# Patient Record
Sex: Female | Born: 1990 | Race: Black or African American | Hispanic: No | Marital: Single | Smoking: Never smoker
Health system: Southern US, Community
[De-identification: ages and names within clinical notes are randomized; demographics above are authoritative.]

## PROBLEM LIST (undated history)

## (undated) ENCOUNTER — Inpatient Hospital Stay (HOSPITAL_COMMUNITY): Payer: Self-pay

## (undated) DIAGNOSIS — Z98891 History of uterine scar from previous surgery: Secondary | ICD-10-CM

## (undated) DIAGNOSIS — K8020 Calculus of gallbladder without cholecystitis without obstruction: Secondary | ICD-10-CM

## (undated) DIAGNOSIS — F32A Depression, unspecified: Secondary | ICD-10-CM

## (undated) DIAGNOSIS — D573 Sickle-cell trait: Secondary | ICD-10-CM

## (undated) DIAGNOSIS — F329 Major depressive disorder, single episode, unspecified: Secondary | ICD-10-CM

## (undated) DIAGNOSIS — R519 Headache, unspecified: Secondary | ICD-10-CM

## (undated) DIAGNOSIS — R51 Headache: Secondary | ICD-10-CM

## (undated) DIAGNOSIS — K219 Gastro-esophageal reflux disease without esophagitis: Secondary | ICD-10-CM

## (undated) DIAGNOSIS — N390 Urinary tract infection, site not specified: Secondary | ICD-10-CM

## (undated) DIAGNOSIS — Z9049 Acquired absence of other specified parts of digestive tract: Secondary | ICD-10-CM

## (undated) HISTORY — DX: Major depressive disorder, single episode, unspecified: F32.9

## (undated) HISTORY — DX: Urinary tract infection, site not specified: N39.0

## (undated) HISTORY — DX: Depression, unspecified: F32.A

## (undated) HISTORY — PX: APPENDECTOMY: SHX54

---

## 2014-07-12 ENCOUNTER — Encounter: Payer: Self-pay | Admitting: Family

## 2014-07-12 ENCOUNTER — Ambulatory Visit (INDEPENDENT_AMBULATORY_CARE_PROVIDER_SITE_OTHER)
Admission: RE | Admit: 2014-07-12 | Discharge: 2014-07-12 | Disposition: A | Discharge status: Home / Self Care | Payer: BLUE CROSS/BLUE SHIELD | Source: Ambulatory Visit | Attending: Family | Admitting: Family

## 2014-07-12 ENCOUNTER — Ambulatory Visit (INDEPENDENT_AMBULATORY_CARE_PROVIDER_SITE_OTHER): Payer: BLUE CROSS/BLUE SHIELD | Admitting: Family

## 2014-07-12 VITALS — BP 110/70 | HR 77 | Temp 98.7°F | Ht 66.5 in | Wt 117.2 lb

## 2014-07-12 DIAGNOSIS — M419 Scoliosis, unspecified: Secondary | ICD-10-CM

## 2014-07-12 NOTE — Progress Notes (Signed)
   Subjective:    Patient ID: Laura Krause, female    DOB: 02-24-1991, 24 y.o.   MRN: [id number]  Chief Complaint  Patient presents with  . Establish Care    Scoliosis    HPI:  Laura Krause is a 24 y.o. female who presents today to establish care and discuss her scoliosis.   1) Back pain - This is a chronic problem. Associated symptom of pain located in her back has been going on for about 5 years now. She was diagnosed with scoliosis previously and believes it is progressively worsening. She notes headaches, numbness and tingling in both legs that has progressively increased. Modifying factors include physical therapy which helped minimally. Last x-rays were done about 4 years ago. Pain is described as dull headache and her back is dull and sharp depending upon how her day goes. Timing of symptoms are worse during the end of the day. Headaches she has tried multiple over the counter medications which seem to help. Back pain intensity is rated about a 7-8/10.   No Known Allergies   No current outpatient prescriptions on file prior to visit.   No current facility-administered medications on file prior to visit.    Past Medical History  Diagnosis Date  . Depression   . UTI (lower urinary tract infection)     No past surgical history on file.  Family History  Problem Relation Age of Onset  . Healthy Mother   . Arthritis Maternal Grandmother   . Stroke Maternal Grandfather   . Hypertension Maternal Grandfather     History   Social History  . Marital Status: Single    Spouse Name: N/A  . Number of Children: N/A  . Years of Education: N/A   Occupational History  . Not on file.   Social History Main Topics  . Smoking status: Never Smoker   . Smokeless tobacco: Never Used  . Alcohol Use: Yes     Comment: Socially  . Drug Use: No  . Sexual Activity: Yes    Birth Control/ Protection: None   Other Topics Concern  . Not on file   Social History Narrative     Review of Systems  Musculoskeletal: Positive for back pain.  Neurological: Positive for numbness.      Objective:    BP 110/70 mmHg  Pulse 77  Temp(Src) 98.7 F (37.1 C) (Oral)  Ht 5' 6.5" (1.689 m)  Wt 117 lb 4 oz (53.184 kg)  BMI 18.64 kg/m2  SpO2 99%  LMP 07/09/2014 Nursing note and vital signs reviewed.  Physical Exam  Constitutional: She is oriented to person, place, and time. She appears well-developed and well-nourished. No distress.  Cardiovascular: Normal rate, regular rhythm, normal heart sounds and intact distal pulses.   Pulmonary/Chest: Effort normal and breath sounds normal.  Musculoskeletal:  No obvious discoloration or edema of thoracic spine. Increased curvature noted with forwards flexion. Left shoulder is lower than right shoulder.  Range of motion is complete in all directions. Sensation and pulses are intact and appropriate.   Neurological: She is alert and oriented to person, place, and time.  Skin: Skin is warm and dry.  Psychiatric: She has a normal mood and affect. Her behavior is normal. Judgment and thought content normal.       Assessment & Plan:

## 2014-07-12 NOTE — Progress Notes (Signed)
Pre visit review using our clinic review tool, if applicable. No additional management support is needed unless otherwise documented below in the visit note. 

## 2014-07-12 NOTE — Assessment & Plan Note (Signed)
Symptoms and exam consistent with scoliosis. This is also most likely the cause of her headaches and other symptoms for which she is experiencing. Obtain thoracic and lumbar films. Pending results, referral to scoliosis and spine specialists for follow-up.

## 2014-07-12 NOTE — Patient Instructions (Signed)
Thank you for choosing [facility].  Summary/Instructions:  Please stop by radiology on the basement level of the building for your x-rays. Your results will be released to MyChart (or called to you) after review, usually within 72 hours after test completion. If any treatments or changes are necessary, you will be notified at that same time.  If your symptoms worsen or fail to improve, please contact our office for further instruction, or in case of emergency go directly to the emergency room at the closest medical facility.    Scoliosis Scoliosis is the name given to a spine that curves sideways.Scoliosis can cause twisting of your shoulders, hips, chest, back, and rib cage.  CAUSES  The cause of scoliosis is not always known. It may be caused by a birth defect or by a disease that can cause muscular dysfunction and imbalance, such as cerebral palsy and muscular dystrophy.  RISK FACTORS Having a disease that causes muscle disease or dysfunction. SIGNS AND SYMPTOMS Scoliosis often has no signs or symptoms.If they are present, they may include:  Unequal size of one body side compared to the other (asymmetry).  Visible curvature of the spine.  Pain. The pain may limit physical activity.  Shortness of breath.  Bowel or bladder issues. DIAGNOSIS A skilled health care provider will perform an evaluation. This will involve:  Taking your history.  Performing a physical examination.  Performing a neurological exam to detect nerve or muscle function loss.  Range of motion studies on the spine.  X-rays. An MRI may also be obtained. TREATMENT  Treatment varies depending on the nature, extent, and severity of the disease. If the curvature is not great, you may need only observation. A brace may be used to prevent scoliosis from progressing. A brace may also be needed during growth spurts. Physical therapy may be of benefit. Surgery may be required.  HOME CARE INSTRUCTIONS    Your health care provider may suggest exercises to strengthen your muscles. Perform them as directed.  Ask your health care provider before participating in any sports.   If you have been prescribed an orthopedic brace, wear it as instructed by your health care provider. SEEK MEDICAL CARE IF: Your brace causes the skin to become sore (chafe) or is uncomfortable.  SEEK IMMEDIATE MEDICAL CARE IF:  You have back pain that is not relieved by the medicines prescribed by your health care provider.   Your legs feel weak or you lose function in your legs.  You lose some bowel or bladder control.  Document Released: 03/30/2000 Document Revised: 04/07/2013 Document Reviewed: 12/08/2012 [facility] Patient Information 2015 [place], [place]. This information is not intended to replace advice given to you by your health care provider. Make sure you discuss any questions you have with your health care provider.

## 2014-07-13 ENCOUNTER — Telehealth: Payer: Self-pay | Admitting: Family

## 2014-07-13 DIAGNOSIS — M4120 Other idiopathic scoliosis, site unspecified: Secondary | ICD-10-CM

## 2014-07-13 NOTE — Telephone Encounter (Signed)
Pt aware of results 

## 2014-07-13 NOTE — Telephone Encounter (Signed)
LVM for pt to call back.

## 2014-07-13 NOTE — Telephone Encounter (Signed)
Please inform the patient that her x-rays show a mid back scoliosis like we discussed. Therefore since she is having symptoms, we will send her to [facility].

## 2014-10-20 ENCOUNTER — Encounter (HOSPITAL_COMMUNITY): Payer: Self-pay | Admitting: Emergency Medicine

## 2014-10-20 ENCOUNTER — Emergency Department (HOSPITAL_COMMUNITY): Payer: BLUE CROSS/BLUE SHIELD

## 2014-10-20 ENCOUNTER — Observation Stay (HOSPITAL_COMMUNITY)
Admission: EM | Admit: 2014-10-20 | Discharge: 2014-10-21 | Disposition: A | Discharge status: Home / Self Care | Payer: BLUE CROSS/BLUE SHIELD | Attending: Internal Medicine | Admitting: Internal Medicine

## 2014-10-20 DIAGNOSIS — K529 Noninfective gastroenteritis and colitis, unspecified: Principal | ICD-10-CM | POA: Diagnosis present

## 2014-10-20 DIAGNOSIS — R000 Tachycardia, unspecified: Secondary | ICD-10-CM | POA: Diagnosis not present

## 2014-10-20 DIAGNOSIS — D72829 Elevated white blood cell count, unspecified: Secondary | ICD-10-CM | POA: Diagnosis not present

## 2014-10-20 DIAGNOSIS — R197 Diarrhea, unspecified: Secondary | ICD-10-CM

## 2014-10-20 DIAGNOSIS — R509 Fever, unspecified: Secondary | ICD-10-CM | POA: Diagnosis present

## 2014-10-20 DIAGNOSIS — R1010 Upper abdominal pain, unspecified: Secondary | ICD-10-CM | POA: Diagnosis present

## 2014-10-20 DIAGNOSIS — E876 Hypokalemia: Secondary | ICD-10-CM | POA: Insufficient documentation

## 2014-10-20 DIAGNOSIS — K8020 Calculus of gallbladder without cholecystitis without obstruction: Secondary | ICD-10-CM | POA: Insufficient documentation

## 2014-10-20 DIAGNOSIS — R112 Nausea with vomiting, unspecified: Secondary | ICD-10-CM

## 2014-10-20 DIAGNOSIS — R109 Unspecified abdominal pain: Secondary | ICD-10-CM | POA: Diagnosis present

## 2014-10-20 HISTORY — DX: Gastro-esophageal reflux disease without esophagitis: K21.9

## 2014-10-20 HISTORY — DX: Sickle-cell trait: D57.3

## 2014-10-20 HISTORY — DX: Headache, unspecified: R51.9

## 2014-10-20 HISTORY — DX: Headache: R51

## 2014-10-20 LAB — URINALYSIS, ROUTINE W REFLEX MICROSCOPIC
BILIRUBIN URINE: NEGATIVE
Glucose, UA: NEGATIVE mg/dL
KETONES UR: 15 mg/dL — AB
Leukocytes, UA: NEGATIVE
Nitrite: NEGATIVE
PH: 5 (ref 5.0–8.0)
Protein, ur: NEGATIVE mg/dL
Specific Gravity, Urine: 1.022 (ref 1.005–1.030)
Urobilinogen, UA: 0.2 mg/dL (ref 0.0–1.0)

## 2014-10-20 LAB — COMPREHENSIVE METABOLIC PANEL
ALT: 14 U/L (ref 14–54)
AST: 19 U/L (ref 15–41)
Albumin: 4.1 g/dL (ref 3.5–5.0)
Alkaline Phosphatase: 47 U/L (ref 38–126)
Anion gap: 9 (ref 5–15)
BUN: 7 mg/dL (ref 6–20)
CALCIUM: 8.8 mg/dL — AB (ref 8.9–10.3)
CO2: 24 mmol/L (ref 22–32)
CREATININE: 0.41 mg/dL — AB (ref 0.44–1.00)
Chloride: 105 mmol/L (ref 101–111)
Glucose, Bld: 141 mg/dL — ABNORMAL HIGH (ref 65–99)
Potassium: 3.3 mmol/L — ABNORMAL LOW (ref 3.5–5.1)
Sodium: 138 mmol/L (ref 135–145)
Total Bilirubin: 1.2 mg/dL (ref 0.3–1.2)
Total Protein: 7.2 g/dL (ref 6.5–8.1)

## 2014-10-20 LAB — CBC WITH DIFFERENTIAL/PLATELET
BASOS ABS: 0 10*3/uL (ref 0.0–0.1)
Basophils Relative: 0 % (ref 0–1)
Eosinophils Absolute: 0.1 10*3/uL (ref 0.0–0.7)
Eosinophils Relative: 1 % (ref 0–5)
HCT: 35.5 % — ABNORMAL LOW (ref 36.0–46.0)
Hemoglobin: 12.1 g/dL (ref 12.0–15.0)
LYMPHS ABS: 0.7 10*3/uL (ref 0.7–4.0)
LYMPHS PCT: 5 % — AB (ref 12–46)
MCH: 23.9 pg — ABNORMAL LOW (ref 26.0–34.0)
MCHC: 34.1 g/dL (ref 30.0–36.0)
MCV: 70 fL — ABNORMAL LOW (ref 78.0–100.0)
MONOS PCT: 4 % (ref 3–12)
Monocytes Absolute: 0.5 10*3/uL (ref 0.1–1.0)
NEUTROS PCT: 90 % — AB (ref 43–77)
Neutro Abs: 11.8 10*3/uL — ABNORMAL HIGH (ref 1.7–7.7)
PLATELETS: 263 10*3/uL (ref 150–400)
RBC: 5.07 MIL/uL (ref 3.87–5.11)
RDW: 21.8 % — AB (ref 11.5–15.5)
WBC: 13.1 10*3/uL — ABNORMAL HIGH (ref 4.0–10.5)

## 2014-10-20 LAB — POC URINE PREG, ED: PREG TEST UR: NEGATIVE

## 2014-10-20 LAB — URINE MICROSCOPIC-ADD ON

## 2014-10-20 LAB — LIPASE, BLOOD: LIPASE: 20 U/L — AB (ref 22–51)

## 2014-10-20 MED ORDER — PROMETHAZINE HCL 25 MG/ML IJ SOLN
12.5000 mg | Freq: Once | INTRAMUSCULAR | Status: DC
  Filled 2014-10-20: qty 1

## 2014-10-20 MED ORDER — ACETAMINOPHEN 500 MG PO TABS
1000.0000 mg | ORAL_TABLET | Freq: Once | ORAL | Status: AC
  Administered 2014-10-20: 1000 mg via ORAL
  Filled 2014-10-20: qty 2

## 2014-10-20 MED ORDER — IOHEXOL 300 MG/ML  SOLN
100.0000 mL | Freq: Once | INTRAMUSCULAR | Status: AC | PRN
  Administered 2014-10-20: 100 mL via INTRAVENOUS

## 2014-10-20 MED ORDER — PROMETHAZINE HCL 25 MG/ML IJ SOLN
12.5000 mg | Freq: Once | INTRAMUSCULAR | Status: AC
  Administered 2014-10-20: 12.5 mg via INTRAVENOUS
  Filled 2014-10-20: qty 1

## 2014-10-20 MED ORDER — SODIUM CHLORIDE 0.9 % IV SOLN
1000.0000 mL | Freq: Once | INTRAVENOUS | Status: AC
  Administered 2014-10-20: 1000 mL via INTRAVENOUS

## 2014-10-20 MED ORDER — ONDANSETRON HCL 4 MG/2ML IJ SOLN
4.0000 mg | Freq: Once | INTRAMUSCULAR | Status: AC
  Administered 2014-10-20: 4 mg via INTRAVENOUS
  Filled 2014-10-20: qty 2

## 2014-10-20 MED ORDER — GI COCKTAIL ~~LOC~~
30.0000 mL | Freq: Once | ORAL | Status: AC
  Administered 2014-10-20: 30 mL via ORAL
  Filled 2014-10-20: qty 30

## 2014-10-20 MED ORDER — MORPHINE SULFATE 2 MG/ML IJ SOLN
2.0000 mg | INTRAMUSCULAR | Status: AC | PRN
  Administered 2014-10-20 – 2014-10-21 (×2): 2 mg via INTRAVENOUS
  Filled 2014-10-20 (×2): qty 1

## 2014-10-20 MED ORDER — FENTANYL CITRATE (PF) 100 MCG/2ML IJ SOLN
50.0000 ug | Freq: Once | INTRAMUSCULAR | Status: AC
  Administered 2014-10-20: 50 ug via INTRAVENOUS
  Filled 2014-10-20: qty 2

## 2014-10-20 MED ORDER — IOHEXOL 300 MG/ML  SOLN
25.0000 mL | INTRAMUSCULAR | Status: AC
  Administered 2014-10-20: 25 mL via ORAL

## 2014-10-20 MED ORDER — ONDANSETRON HCL 4 MG PO TABS: 4.0000 mg | ORAL_TABLET | Freq: Four times a day (QID) | ORAL | Status: DC | PRN

## 2014-10-20 MED ORDER — LOPERAMIDE HCL 2 MG PO CAPS: 4.0000 mg | ORAL_CAPSULE | Freq: Once | ORAL | Status: DC

## 2014-10-20 MED ORDER — ACETAMINOPHEN 650 MG RE SUPP: 650.0000 mg | Freq: Four times a day (QID) | RECTAL | Status: DC | PRN

## 2014-10-20 MED ORDER — SODIUM CHLORIDE 0.9 % IV BOLUS (SEPSIS)
1000.0000 mL | Freq: Once | INTRAVENOUS | Status: AC
  Administered 2014-10-20: 1000 mL via INTRAVENOUS

## 2014-10-20 MED ORDER — POTASSIUM CHLORIDE CRYS ER 20 MEQ PO TBCR
40.0000 meq | EXTENDED_RELEASE_TABLET | Freq: Once | ORAL | Status: AC
  Administered 2014-10-20: 40 meq via ORAL
  Filled 2014-10-20: qty 2

## 2014-10-20 MED ORDER — PIPERACILLIN-TAZOBACTAM 3.375 G IVPB
3.3750 g | Freq: Once | INTRAVENOUS | Status: AC
  Administered 2014-10-20: 3.375 g via INTRAVENOUS
  Filled 2014-10-20: qty 50

## 2014-10-20 MED ORDER — CIPROFLOXACIN IN D5W 400 MG/200ML IV SOLN
400.0000 mg | Freq: Two times a day (BID) | INTRAVENOUS | Status: DC
  Administered 2014-10-20 – 2014-10-21 (×2): 400 mg via INTRAVENOUS
  Filled 2014-10-20 (×4): qty 200

## 2014-10-20 MED ORDER — ENOXAPARIN SODIUM 40 MG/0.4ML ~~LOC~~ SOLN
40.0000 mg | SUBCUTANEOUS | Status: DC
  Administered 2014-10-20: 40 mg via SUBCUTANEOUS
  Filled 2014-10-20 (×2): qty 0.4

## 2014-10-20 MED ORDER — KETOROLAC TROMETHAMINE 15 MG/ML IJ SOLN
15.0000 mg | Freq: Four times a day (QID) | INTRAMUSCULAR | Status: DC | PRN
  Administered 2014-10-20: 15 mg via INTRAVENOUS
  Filled 2014-10-20 (×2): qty 1

## 2014-10-20 MED ORDER — DICYCLOMINE HCL 10 MG/ML IM SOLN
20.0000 mg | Freq: Once | INTRAMUSCULAR | Status: AC
  Administered 2014-10-20: 20 mg via INTRAMUSCULAR
  Filled 2014-10-20: qty 2

## 2014-10-20 MED ORDER — ACETAMINOPHEN 325 MG PO TABS
650.0000 mg | ORAL_TABLET | Freq: Four times a day (QID) | ORAL | Status: DC | PRN
  Administered 2014-10-20: 650 mg via ORAL
  Filled 2014-10-20: qty 2

## 2014-10-20 MED ORDER — PANTOPRAZOLE SODIUM 40 MG IV SOLR
40.0000 mg | Freq: Once | INTRAVENOUS | Status: AC
  Administered 2014-10-20: 40 mg via INTRAVENOUS
  Filled 2014-10-20: qty 40

## 2014-10-20 MED ORDER — ONDANSETRON HCL 4 MG/2ML IJ SOLN
4.0000 mg | Freq: Four times a day (QID) | INTRAMUSCULAR | Status: DC | PRN
  Administered 2014-10-20: 4 mg via INTRAVENOUS
  Filled 2014-10-20: qty 2

## 2014-10-20 MED ORDER — FOLIC ACID 5 MG/ML IJ SOLN
1.0000 mg | Freq: Every day | INTRAMUSCULAR | Status: DC
  Administered 2014-10-20: 1 mg via INTRAVENOUS
  Filled 2014-10-20 (×2): qty 0.2

## 2014-10-20 MED ORDER — METRONIDAZOLE IN NACL 5-0.79 MG/ML-% IV SOLN
500.0000 mg | Freq: Three times a day (TID) | INTRAVENOUS | Status: DC
  Administered 2014-10-20 – 2014-10-21 (×3): 500 mg via INTRAVENOUS
  Filled 2014-10-20 (×7): qty 100

## 2014-10-20 MED ORDER — THIAMINE HCL 100 MG/ML IJ SOLN
100.0000 mg | Freq: Every day | INTRAMUSCULAR | Status: DC
  Administered 2014-10-20: 100 mg via INTRAVENOUS
  Filled 2014-10-20: qty 1
  Filled 2014-10-20: qty 2

## 2014-10-20 NOTE — ED Notes (Signed)
Pt states that she starting having abdominal pain around 2000 last night. Pt states that she started vomiting and having diarrhea at midnight. Pt states that she is unable to keep anything down.

## 2014-10-20 NOTE — ED Notes (Signed)
Pt states she threw up when she went to the bathroom.

## 2014-10-20 NOTE — ED Notes (Signed)
Ambulated pt to bathroom. Noted she had to throw up.

## 2014-10-20 NOTE — ED Notes (Signed)
Pt resting, no needs at this time; visitor at bedside

## 2014-10-20 NOTE — ED Notes (Signed)
Revonda StandardAllison, NP at the bedside to admit.

## 2014-10-20 NOTE — ED Provider Notes (Signed)
Patient's care assumed at sign out, 0700. On my initial exam the patient remains uncomfortable appearing, with intermittent abdominal pain, persistent tachycardia. Given the patient's persistent tachycardia, symptoms, IV fluids continued.  Update: Patient states that her pain has improved, though her nausea, anorexia or persistent, and after 3 L of fluid resuscitation, she remained tachycardic.  Update: Patient now febrile, 102, with tachycardia, abdominal pain, CT scan performed.  3:09 PM I reviewed the results (including imaging), agree with the interpretation.   On repeat exam the patient appears better.  We reviewed all findings.  Given the persistent tachycardia, fever, nausea, anorexia, patient was treated with IV antibiotics, will be admitted for observation, further evaluation and management.   Gerhard Munchobert Rashema Seawright, MD 10/20/14 [phone]

## 2014-10-20 NOTE — ED Notes (Signed)
Attempted report 

## 2014-10-20 NOTE — H&P (Signed)
[facility] History and Physical                                                                                    Laura HammedJelesha Krause, is a 24 y.o. female  MRN: [id number]   DOB - Oct 31, 1990  Admit Date - 10/20/2014  Outpatient Primary MD for the patient is Jeanine Luzalone, Gregory, FNP  Referring MD: Jeraldine LootsLockwood / ER  With History of -  Past Medical History  Diagnosis Date  . Depression   . UTI (lower urinary tract infection)       History reviewed. No pertinent past surgical history.  in for   Chief Complaint  Patient presents with  . Nausea  . Emesis  . Abdominal Pain     HPI This is a 24 year old female patient with no significant medical problems other than prior UTI and depression. She presents to the hospital with abrupt onset of nausea vomiting with diarrhea that began last night. She correlates this with ingestion of pasta salad the evening before. She reports her symptoms began as reflux type symptoms and a sensation of feeling full or bloated. This was subsequently followed by repeated episodes of nausea and vomiting with non- bloody watery diarrhea. She finally presented to the ER around 4 AM because of severe abdominal pain and fever. Denies recent antibiotics or other sick contacts.  Upon presentation to the ER she was afebrile and slightly tachycardic with a heart rate of 101 and she was normotensive with a BP of 104/70. Her initial lab work was unremarkable except for potassium of 3.3, glucose 141, lipase was oh at 20, white count was slightly elevated at 13,100 with a neutrophil count of 90%, MCV low at 70. CT of the abdomen and pelvis was unremarkable except for solitary gallstone. Since arrival patient has been given at least 3 L of IV fluid and multiple doses of pain medications as well as a GI cocktail, Imodium, Zofran, Protonix and Phenergan. Her nausea has improved and she's had no further episodes of emesis or diarrhea for at least 4 hours. Her abdominal pain has also  resolved and she is tolerating water. Unfortunately around noon she spiked a temperature to 102.1 which has subsequently decreased to 100.7. While febrile she's had heart rates in the 120 range. She is maintaining O2 saturations above 90% on room air. EDP discussed the case with general surgery who found no acute surgical needs at this time.   Review of Systems   In addition to the HPI above,  No Headache, changes with Vision or hearing, new weakness, tingling, numbness in any extremity, No problems swallowing food or Liquids, indigestion/reflux No Chest pain, Cough or Shortness of Breath, palpitations, orthopnea or DOE No melena or hematochezia, no dark tarry stools No dysuria, hematuria or flank pain No new skin rashes, lesions, masses or bruises, No new joints pains-aches No recent weight gain or loss No polyuria, polydypsia or polyphagia,  *A full 10 point Review of Systems was done, except as stated above, all other Review of Systems were negative.  Social History History  Substance Use Topics  . Smoking status: Never Smoker   . Smokeless tobacco: Never  Used  . Alcohol Use: Yes     Comment: Socially    Resides at: Private residence  Lives with: Alone  Ambulatory status: Without assistive devices  Employment status: Consulting civil engineer at [employer]   Family History Family History  Problem Relation Age of Onset  . Healthy Mother   . Arthritis Maternal Grandmother   . Stroke Maternal Grandfather   . Hypertension Maternal Grandfather      Prior to Admission medications   Not on File    No Known Allergies  Physical Exam  Vitals  Blood pressure 97/46, pulse 121, temperature 100.7 F (38.2 C), temperature source Oral, resp. rate 16, height 5' 6.5" (1.689 m), weight 120 lb (54.432 kg), last menstrual period 09/24/2014, SpO2 98 %.   General:  In no acute distress, does not appear toxic and is reporting able to tolerate water  Psych:  Normal affect, Denies Suicidal or  Homicidal ideations, Awake Alert, Oriented X 3. Speech and thought patterns are clear and appropriate, no apparent short term memory deficits  Neuro:   No focal neurological deficits, CN II through XII intact, Strength 5/5 all 4 extremities, Sensation intact all 4 extremities.  ENT:  Ears and Eyes appear Normal, Conjunctivae clear, PER. Moist oral mucosa without erythema or exudates.  Neck:  Supple, No lymphadenopathy appreciated  Respiratory:  Symmetrical chest wall movement, Good air movement bilaterally, CTAB. Room Air  Cardiac:  RRR, No Murmurs, no LE edema noted, no JVD, No carotid bruits, peripheral pulses palpable at 2+  Abdomen:  Positive bowel sounds, Soft, Non tender, Non distended,  No masses appreciated, no obvious hepatosplenomegaly  Skin:  No Cyanosis, Normal Skin Turgor, No Skin Rash or Bruise.  Extremities: Symmetrical without obvious trauma or injury,  no effusions.  Data Review  CBC  Recent Labs Lab 10/20/14 0412  WBC 13.1*  HGB 12.1  HCT 35.5*  PLT 263  MCV 70.0*  MCH 23.9*  MCHC 34.1  RDW 21.8*  LYMPHSABS 0.7  MONOABS 0.5  EOSABS 0.1  BASOSABS 0.0    Chemistries   Recent Labs Lab 10/20/14 0412  NA 138  K 3.3*  CL 105  CO2 24  GLUCOSE 141*  BUN 7  CREATININE 0.41*  CALCIUM 8.8*  AST 19  ALT 14  ALKPHOS 47  BILITOT 1.2    estimated creatinine clearance is 94.7 mL/min (by C-G formula based on Cr of 0.41).  No results for input(s): TSH, T4TOTAL, T3FREE, THYROIDAB in the last 72 hours.  Invalid input(s): FREET3  Coagulation profile No results for input(s): INR, PROTIME in the last 168 hours.  No results for input(s): DDIMER in the last 72 hours.  Cardiac Enzymes No results for input(s): CKMB, TROPONINI, MYOGLOBIN in the last 168 hours.  Invalid input(s): CK  Invalid input(s): POCBNP  Urinalysis    Component Value Date/Time   COLORURINE AMBER* 10/20/2014 0648   APPEARANCEUR CLEAR 10/20/2014 0648   LABSPEC 1.022 10/20/2014  0648   PHURINE 5.0 10/20/2014 0648   GLUCOSEU NEGATIVE 10/20/2014 0648   HGBUR TRACE* 10/20/2014 0648   BILIRUBINUR NEGATIVE 10/20/2014 0648   KETONESUR 15* 10/20/2014 0648   PROTEINUR NEGATIVE 10/20/2014 0648   UROBILINOGEN 0.2 10/20/2014 0648   NITRITE NEGATIVE 10/20/2014 0648   LEUKOCYTESUR NEGATIVE 10/20/2014 0648    Imaging results:   Ct Abdomen Pelvis W Contrast  10/20/2014   CLINICAL DATA:  Predominantly periumbilical abdominal pain beginning last night. Vomiting and diarrhea. Leukocytosis.  EXAM: CT ABDOMEN AND PELVIS WITH CONTRAST  TECHNIQUE:  Multidetector CT imaging of the abdomen and pelvis was performed using the standard protocol following bolus administration of intravenous contrast.  CONTRAST:  OMNIPAQUE IOHEXOL 300 MG/ML  SOLN  COMPARISON:  None.  FINDINGS: The visualized lung bases are clear.  A small stone is noted in the gall gallbladder. The spleen is borderline enlarged. The liver, adrenal glands, kidneys, and pancreas are unremarkable.  There is no evidence of bowel obstruction. No gross bowel wall thickening is identified. Appendix is visualized in the right lower quadrant and is unremarkable.  Bladder is unremarkable. No pelvic mass is seen. No free fluid or enlarged lymph nodes are seen. No acute osseous abnormality is identified.  IMPRESSION: 1. No acute abnormality identified in the abdomen or pelvis. 2. Cholelithiasis.   Electronically Signed   By: Sebastian Ache   On: 10/20/2014 13:30      Assessment & Plan  Principal Problem:   Gastroenteritis, acute -Admit observational status to medical floor -Differential includes food poisoning/foodborne illness versus gastroenteritis -Empiric Cipro and Flagyl IV -Follow up on labs in a.m. -Allow clear liquids and slowly advance diet -As precaution check stools for C. difficile PCR  Active Problems:   Fever/ Leukocytosis -Suspect related to acute gastroenteritis either viral or bacterial in etiology    Abdominal  pain, acute -Currently has resolved -Likely secondary to gastroenteritis -CT abdomen and pelvis unremarkable except for solitary gallstone -EDP discussed with general surgery who found no acute surgical needs at this time    Acute hypokalemia -Secondary to emesis and diarrhea -Since tolerating clear liquids with saltine crackers will try 1 dose of oral potassium -Check labs in a.m.    Tachycardia -Remains sinus in nature and appears to be appropriate physiologic response in a young person to dehydration and fever -No indication for telemetry -Continue IV fluid hydration    DVT Prophylaxis: Lovenox  Family Communication:   No family at bedside  Code Status:  Full code  Condition:  Stable  Discharge disposition: Anticipate discharge to home in the next 24 hours upon resolution of nausea vomiting and diarrhea symptoms as well as tachycardia and dehydration  Time spent in minutes : 60      ELLIS,ALLISON L. ANP on 10/20/2014 at 3:57 PM  Between 7am to 7pm - Pager - [phone]  After 7pm go to [email or web address] - password [email or web address]  And look for the night coverage person covering me after hours  [facility]  Patient was seen, examined,treatment plan was discussed with the Advance Practice Provider.  I have directly reviewed the clinical findings, lab, imaging studies and management of this patient in detail. I have made the necessary changes to the above noted documentation, and agree with the documentation, as recorded by the Advance Practice Provider.   Pamella Pert, MD [facility] [phone]

## 2014-10-20 NOTE — Progress Notes (Signed)
Report received by ED RN Hayley.

## 2014-10-20 NOTE — ED Provider Notes (Signed)
CSN: [id number]     Arrival date & time 10/20/14  0357 History   First MD Initiated Contact with Patient 10/20/14 [phone]     Chief Complaint  Patient presents with  . Nausea  . Emesis  . Abdominal Pain     (Consider location/radiation/quality/duration/timing/severity/associated sxs/prior Treatment) HPI 24 year old female presents to the emergency department from home with complaint of upper abdominal pain, nausea, vomiting and diarrhea after having pasta salad.  No fevers or chills.  No sick contacts, no one else ate the posterior falx.  Patient reports she has been vomiting too numerous times to count Past Medical History  Diagnosis Date  . Depression   . UTI (lower urinary tract infection)    History reviewed. No pertinent past surgical history. Family History  Problem Relation Age of Onset  . Healthy Mother   . Arthritis Maternal Grandmother   . Stroke Maternal Grandfather   . Hypertension Maternal Grandfather    History  Substance Use Topics  . Smoking status: Never Smoker   . Smokeless tobacco: Never Used  . Alcohol Use: Yes     Comment: Socially   OB History    No data available     Review of Systems  See History of Present Illness; otherwise all other systems are reviewed and negative   Allergies  Review of patient's allergies indicates no known allergies.  Home Medications   Prior to Admission medications   Medication Sig Start Date End Date Taking? Authorizing Provider  Aspirin POWD 1 packet by Does not apply route as needed (migraines).    Historical Provider, MD   BP 110/68 mmHg  Pulse 101  Temp(Src) 98 F (36.7 C) (Oral)  Resp 17  Ht 5' 6.5" (1.689 m)  Wt 120 lb (54.432 kg)  BMI 19.08 kg/m2  SpO2 100%  LMP 09/24/2014 Physical Exam  Constitutional: She is oriented to person, place, and time. She appears well-developed and well-nourished.  HENT:  Head: Normocephalic and atraumatic.  Nose: Nose normal.  Mouth/Throat: Oropharynx is clear and  moist.  Dry mucous membranes  Eyes: Conjunctivae and EOM are normal. Pupils are equal, round, and reactive to light.  Neck: Normal range of motion. Neck supple. No JVD present. No tracheal deviation present. No thyromegaly present.  Cardiovascular: Normal rate, regular rhythm, normal heart sounds and intact distal pulses.  Exam reveals no gallop and no friction rub.   No murmur heard. Pulmonary/Chest: Effort normal and breath sounds normal. No stridor. No respiratory distress. She has no wheezes. She has no rales. She exhibits no tenderness.  Abdominal: Soft. Bowel sounds are normal. She exhibits no distension and no mass. There is tenderness (tenderness across epigastrium.). There is no rebound and no guarding.  Musculoskeletal: Normal range of motion. She exhibits no edema or tenderness.  Lymphadenopathy:    She has no cervical adenopathy.  Neurological: She is alert and oriented to person, place, and time. She displays normal reflexes. She exhibits normal muscle tone. Coordination normal.  Skin: Skin is warm and dry. No rash noted. No erythema. No pallor.  Psychiatric: She has a normal mood and affect. Her behavior is normal. Judgment and thought content normal.  Nursing note and vitals reviewed.   ED Course  Procedures (including critical care time) Labs Review Labs Reviewed  CBC WITH DIFFERENTIAL/PLATELET - Abnormal; Notable for the following:    WBC 13.1 (*)    HCT 35.5 (*)    MCV 70.0 (*)    MCH 23.9 (*)  RDW 21.8 (*)    Neutrophils Relative % 90 (*)    Lymphocytes Relative 5 (*)    Neutro Abs 11.8 (*)    All other components within normal limits  COMPREHENSIVE METABOLIC PANEL - Abnormal; Notable for the following:    Potassium 3.3 (*)    Glucose, Bld 141 (*)    Creatinine, Ser 0.41 (*)    Calcium 8.8 (*)    All other components within normal limits  LIPASE, BLOOD - Abnormal; Notable for the following:    Lipase 20 (*)    All other components within normal limits   URINALYSIS, ROUTINE W REFLEX MICROSCOPIC (NOT AT [facility]) - Abnormal; Notable for the following:    Color, Urine AMBER (*)    Hgb urine dipstick TRACE (*)    Ketones, ur 15 (*)    All other components within normal limits  URINE MICROSCOPIC-ADD ON - Abnormal; Notable for the following:    Bacteria, UA FEW (*)    All other components within normal limits  POC URINE PREG, ED    Imaging Review No results found.   EKG Interpretation None      MDM   Final diagnoses:  Nausea vomiting and diarrhea    24 year old female with nausea vomiting diarrhea after pasta salad.  Gastroenteritis from food borne illness versus viral etiology.  Plan for IV hydration, labs, antiemetics.   Patient feeling better, however, now with increasing pain and nausea.  Labs show slightly elevated white blood cell count, otherwise unremarkable.  Awaiting urinalysis.  7:25 AM Pt c/o nausea again.  Abdomen reexamined, mild ttp over upper abdomen.  No rebound or guarding.   no [place] sign.  Care passed to Dr Jeraldine LootsLockwood awaiting second liter, phenergan dosing.  Laura Severinlga Mayanna Garlitz, MD 10/20/14 [phone]

## 2014-10-20 NOTE — Progress Notes (Signed)
NURSING PROGRESS NOTE  Laura Krause [id number] Admission Data: 10/20/2014 6:29 PM Attending Provider: Leatha Gildingostin M Gherghe, MD WUJ:[id number]:Calone, Earl LitesGregory, FNP Code Status: Full  Allergies:  Review of patient's allergies indicates no known allergies. Past Medical History:   has a past medical history of Depression and UTI (lower urinary tract infection). Past Surgical History:   has no past surgical history on file. Social History:   reports that she has never smoked. She has never used smokeless tobacco. She reports that she drinks alcohol. She reports that she does not use illicit drugs.  Laura Krause is a 24 y.o. female patient admitted from ED:   Last Documented Vital Signs: Blood pressure 116/67, pulse 122, temperature 99.5 F (37.5 C), temperature source Oral, resp. rate 18, height 5' 6.5" (1.689 m), weight 54.432 kg (120 lb), last menstrual period 09/24/2014, SpO2 100 %.  Cardiac Monitoring:  None ordered.  IV Fluids:  IV in place, occlusive dsg intact without redness, IV cath antecubital right, condition patent and no redness none.   Skin: WDL  Patient/Family orientated to room. Information packet given to patient/family. Admission inpatient armband information verified with patient/family to include name and date of birth and placed on patient arm. Side rails up x 2, fall assessment and education completed with patient/family. Patient/family able to verbalize understanding of risk associated with falls and verbalized understanding to call for assistance before getting out of bed. Call light within reach. Patient/family able to voice and demonstrate understanding of unit orientation instructions.    Will continue to evaluate and treat per MD orders.   Leane PlattSpencer Maleeya Peterkin RN, BS, BSN

## 2014-10-21 DIAGNOSIS — K529 Noninfective gastroenteritis and colitis, unspecified: Secondary | ICD-10-CM | POA: Diagnosis not present

## 2014-10-21 LAB — CLOSTRIDIUM DIFFICILE BY PCR: Toxigenic C. Difficile by PCR: NEGATIVE

## 2014-10-21 LAB — CBC
HCT: 29.3 % — ABNORMAL LOW (ref 36.0–46.0)
Hemoglobin: 9.8 g/dL — ABNORMAL LOW (ref 12.0–15.0)
MCH: 24 pg — AB (ref 26.0–34.0)
MCHC: 33.4 g/dL (ref 30.0–36.0)
MCV: 71.6 fL — AB (ref 78.0–100.0)
PLATELETS: 163 10*3/uL (ref 150–400)
RBC: 4.09 MIL/uL (ref 3.87–5.11)
RDW: 21.9 % — ABNORMAL HIGH (ref 11.5–15.5)
WBC: 7.2 10*3/uL (ref 4.0–10.5)

## 2014-10-21 LAB — COMPREHENSIVE METABOLIC PANEL
ALK PHOS: 32 U/L — AB (ref 38–126)
ALT: 14 U/L (ref 14–54)
ANION GAP: 5 (ref 5–15)
AST: 20 U/L (ref 15–41)
Albumin: 3 g/dL — ABNORMAL LOW (ref 3.5–5.0)
CHLORIDE: 110 mmol/L (ref 101–111)
CO2: 23 mmol/L (ref 22–32)
Calcium: 7.9 mg/dL — ABNORMAL LOW (ref 8.9–10.3)
Creatinine, Ser: 0.45 mg/dL (ref 0.44–1.00)
GLUCOSE: 103 mg/dL — AB (ref 65–99)
Potassium: 3.9 mmol/L (ref 3.5–5.1)
Sodium: 138 mmol/L (ref 135–145)
Total Bilirubin: 1.7 mg/dL — ABNORMAL HIGH (ref 0.3–1.2)
Total Protein: 5.5 g/dL — ABNORMAL LOW (ref 6.5–8.1)

## 2014-10-21 MED ORDER — ONDANSETRON HCL 4 MG PO TABS: 4.0000 mg | ORAL_TABLET | Freq: Four times a day (QID) | ORAL | Status: DC | PRN

## 2014-10-21 NOTE — Progress Notes (Signed)
24 year old independent female from home with viral gastro vs food poisening. No home needs identified, discharge anticipated today if tolerates diet.

## 2014-10-21 NOTE — Progress Notes (Signed)
Laura HammedJelesha Krause to be D/C'd Home per MD order.  Discussed with the patient and all questions fully answered.  VSS, Skin clean, dry and intact without evidence of skin break down, no evidence of skin tears noted. IV catheter discontinued intact. Site without signs and symptoms of complications. Dressing and pressure applied.  An After Visit Summary was printed and given to the patient. Patient received prescription.  D/c education completed with patient/family including follow up instructions, medication list, d/c activities limitations if indicated, with other d/c instructions as indicated by MD - patient able to verbalize understanding, all questions fully answered.   Patient instructed to return to ED, call 911, or call MD for any changes in condition.   Patient escorted via WC, and D/C home via private auto.  L'ESPERANCE, [name] 10/21/2014 5:41 PM

## 2014-10-21 NOTE — Progress Notes (Signed)
MD updated on patient status. Patient stating "I am feeling better". Pt denies nausea and vomiting. Pt has had 4 episodes of diarrhea today.

## 2014-10-21 NOTE — Discharge Summary (Signed)
Physician Discharge Summary  Laura Krause MRN: [id number] DOB/AGE: 22/22/1992 22 y.o.  PCP: Mauricio Po, FNP   Admit date: 10/20/2014 Discharge date: 10/21/2014  Discharge Diagnoses:     Principal Problem:   Gastroenteritis, acute Probable food poisoning   Fever   Tachycardia   Abdominal pain, acute   Acute hypokalemia   Leukocytosis Cholelithiasis   Follow-up recommendations Follow-up with PCP in 3-5 days , including although additional recommended appointments as below Follow-up CBC, CMP in 3-5 days      Medication List    TAKE these medications        ondansetron 4 MG tablet  Commonly known as:  ZOFRAN  Take 1 tablet (4 mg total) by mouth every 6 (six) hours as needed for nausea.         Discharge Condition: Stable    Disposition: Final discharge disposition not confirmed   Consults: None    Significant Diagnostic Studies:  Ct Abdomen Pelvis W Contrast  10/20/2014   CLINICAL DATA:  Predominantly periumbilical abdominal pain beginning last night. Vomiting and diarrhea. Leukocytosis.  EXAM: CT ABDOMEN AND PELVIS WITH CONTRAST  TECHNIQUE: Multidetector CT imaging of the abdomen and pelvis was performed using the standard protocol following bolus administration of intravenous contrast.  CONTRAST:  148m OMNIPAQUE IOHEXOL 300 MG/ML  SOLN  COMPARISON:  None.  FINDINGS: The visualized lung bases are clear.  A small stone is noted in the gall gallbladder. The spleen is borderline enlarged. The liver, adrenal glands, kidneys, and pancreas are unremarkable.  There is no evidence of bowel obstruction. No gross bowel wall thickening is identified. Appendix is visualized in the right lower quadrant and is unremarkable.  Bladder is unremarkable. No pelvic mass is seen. No free fluid or enlarged lymph nodes are seen. No acute osseous abnormality is identified.  IMPRESSION: 1. No acute abnormality identified in the abdomen or pelvis. 2. Cholelithiasis.   Electronically  Signed   By: ALogan Bores  On: 10/20/2014 13:30      Filed Weights   10/20/14 0403 10/20/14 1825  Weight: 54.432 kg (120 lb) 54.023 kg (119 lb 1.6 oz)     Microbiology: Recent Results (from the past 240 hour(s))  [facility] ([facility]     Status: None   Collection Time: 10/20/14  7:37 PM  Result Value Ref Range Status   C difficile by pcr NEGATIVE NEGATIVE Final       Blood Culture No results found for: SDES, SPECREQUEST, CULT, REPTSTATUS    Labs: Results for orders placed or performed during the hospital encounter of 10/20/14 (from the past 48 hour(s))  CBC with Differential     Status: Abnormal   Collection Time: 10/20/14  4:12 AM  Result Value Ref Range   WBC 13.1 (H) 4.0 - 10.5 K/uL   RBC 5.07 3.87 - 5.11 MIL/uL   Hemoglobin 12.1 12.0 - 15.0 g/dL   HCT 35.5 (L) 36.0 - 46.0 %   MCV 70.0 (L) 78.0 - 100.0 fL   MCH 23.9 (L) 26.0 - 34.0 pg   MCHC 34.1 30.0 - 36.0 g/dL   RDW 21.8 (H) 11.5 - 15.5 %   Platelets 263 150 - 400 K/uL    Comment: SPECIMEN CHECKED FOR CLOTS REPEATED TO VERIFY PLATELET COUNT CONFIRMED BY SMEAR    Neutrophils Relative % 90 (H) 43 - 77 %   Lymphocytes Relative 5 (L) 12 - 46 %   Monocytes Relative 4 3 - 12 %  Eosinophils Relative 1 0 - 5 %   Basophils Relative 0 0 - 1 %   Neutro Abs 11.8 (H) 1.7 - 7.7 K/uL   Lymphs Abs 0.7 0.7 - 4.0 K/uL   Monocytes Absolute 0.5 0.1 - 1.0 K/uL   Eosinophils Absolute 0.1 0.0 - 0.7 K/uL   Basophils Absolute 0.0 0.0 - 0.1 K/uL   RBC Morphology TARGET CELLS     Comment: TEARDROP CELLS POLYCHROMASIA PRESENT   Comprehensive metabolic panel     Status: Abnormal   Collection Time: 10/20/14  4:12 AM  Result Value Ref Range   Sodium 138 135 - 145 mmol/L   Potassium 3.3 (L) 3.5 - 5.1 mmol/L   Chloride 105 101 - 111 mmol/L   CO2 24 22 - 32 mmol/L   Glucose, Bld 141 (H) 65 - 99 mg/dL   BUN 7 6 - 20 mg/dL   Creatinine, Ser 0.41 (L) 0.44 - 1.00 mg/dL   Calcium 8.8 (L) 8.9 - 10.3 mg/dL    Total Protein 7.2 6.5 - 8.1 g/dL   Albumin 4.1 3.5 - 5.0 g/dL   AST 19 15 - 41 U/L   ALT 14 14 - 54 U/L   Alkaline Phosphatase 47 38 - 126 U/L   Total Bilirubin 1.2 0.3 - 1.2 mg/dL   GFR calc non Af Amer >60 >60 mL/min   GFR calc Af Amer >60 >60 mL/min    Comment: (NOTE) The eGFR has been calculated using the CKD EPI equation. This calculation has not been validated in all clinical situations. eGFR's persistently <60 mL/min signify possible Chronic Kidney Disease.    Anion gap 9 5 - 15  Lipase, blood     Status: Abnormal   Collection Time: 10/20/14  4:12 AM  Result Value Ref Range   Lipase 20 (L) 22 - 51 U/L  Urinalysis, Routine w reflex microscopic (not at [facility])     Status: Abnormal   Collection Time: 10/20/14  6:48 AM  Result Value Ref Range   Color, Urine AMBER (A) YELLOW    Comment: BIOCHEMICALS MAY BE AFFECTED BY COLOR   APPearance CLEAR CLEAR   Specific Gravity, Urine 1.022 1.005 - 1.030   pH 5.0 5.0 - 8.0   Glucose, UA NEGATIVE NEGATIVE mg/dL   Hgb urine dipstick TRACE (A) NEGATIVE   Bilirubin Urine NEGATIVE NEGATIVE   Ketones, ur 15 (A) NEGATIVE mg/dL   Protein, ur NEGATIVE NEGATIVE mg/dL   Urobilinogen, UA 0.2 0.0 - 1.0 mg/dL   Nitrite NEGATIVE NEGATIVE   Leukocytes, UA NEGATIVE NEGATIVE  Urine microscopic-add on     Status: Abnormal   Collection Time: 10/20/14  6:48 AM  Result Value Ref Range   Squamous Epithelial / LPF RARE RARE   WBC, UA 0-2 <3 WBC/hpf   RBC / HPF 3-6 <3 RBC/hpf   Bacteria, UA FEW (A) RARE   Urine-Other MUCOUS PRESENT   POC Urine Pregnancy, ED  (If Pre-menopausal female)  not at [facility]     Status: None   Collection Time: 10/20/14  6:51 AM  Result Value Ref Range   Preg Test, Ur NEGATIVE NEGATIVE    Comment:        THE SENSITIVITY OF THIS METHODOLOGY IS >24 mIU/mL   Clostridium Difficile by PCR (not at [facility])     Status: None   Collection Time: 10/20/14  7:37 PM  Result Value Ref Range   C difficile by pcr NEGATIVE NEGATIVE  CBC      Status: Abnormal  Collection Time: 10/21/14  5:24 AM  Result Value Ref Range   WBC 7.2 4.0 - 10.5 K/uL   RBC 4.09 3.87 - 5.11 MIL/uL   Hemoglobin 9.8 (L) 12.0 - 15.0 g/dL   HCT 29.3 (L) 36.0 - 46.0 %   MCV 71.6 (L) 78.0 - 100.0 fL   MCH 24.0 (L) 26.0 - 34.0 pg   MCHC 33.4 30.0 - 36.0 g/dL   RDW 21.9 (H) 11.5 - 15.5 %   Platelets 163 150 - 400 K/uL    Comment: SPECIMEN CHECKED FOR CLOTS REPEATED TO VERIFY PLATELET COUNT CONFIRMED BY SMEAR   Comprehensive metabolic panel     Status: Abnormal   Collection Time: 10/21/14  5:24 AM  Result Value Ref Range   Sodium 138 135 - 145 mmol/L   Potassium 3.9 3.5 - 5.1 mmol/L   Chloride 110 101 - 111 mmol/L   CO2 23 22 - 32 mmol/L   Glucose, Bld 103 (H) 65 - 99 mg/dL   BUN <5 (L) 6 - 20 mg/dL   Creatinine, Ser 0.45 0.44 - 1.00 mg/dL   Calcium 7.9 (L) 8.9 - 10.3 mg/dL   Total Protein 5.5 (L) 6.5 - 8.1 g/dL   Albumin 3.0 (L) 3.5 - 5.0 g/dL   AST 20 15 - 41 U/L   ALT 14 14 - 54 U/L   Alkaline Phosphatase 32 (L) 38 - 126 U/L   Total Bilirubin 1.7 (H) 0.3 - 1.2 mg/dL   GFR calc non Af Amer >60 >60 mL/min   GFR calc Af Amer >60 >60 mL/min    Comment: (NOTE) The eGFR has been calculated using the CKD EPI equation. This calculation has not been validated in all clinical situations. eGFR's persistently <60 mL/min signify possible Chronic Kidney Disease.    Anion gap 5 5 - 15     Lipid Panel  No results found for: CHOL, TRIG, HDL, CHOLHDL, VLDL, LDLCALC, LDLDIRECT   No results found for: HGBA1C   Lab Results  Component Value Date   CREATININE 0.45 10/21/2014     HPI  This is a 24 year old female patient with no significant medical problems other than prior UTI and depression. She presents to the hospital with abrupt onset of nausea vomiting with diarrhea that began last night. She correlates this with ingestion of pasta salad the evening before. She reports her symptoms began as reflux type symptoms and a sensation of feeling full  or bloated. This was subsequently followed by repeated episodes of nausea and vomiting with non- bloody watery diarrhea. She finally presented to the ER around 4 AM because of severe abdominal pain and fever. Denies recent antibiotics or other sick contacts.  Upon presentation to the ER she was afebrile and slightly tachycardic with a heart rate of 101 and she was normotensive with a BP of 104/70. Her initial lab work was unremarkable except for potassium of 3.3, glucose 141, lipase was oh at 20, white count was slightly elevated at 13,100 with a neutrophil count of 90%, MCV low at 70. CT of the abdomen and pelvis was unremarkable except for solitary gallstone. Since arrival patient has been given at least 3 L of IV fluid and multiple doses of pain medications as well as a GI cocktail, Imodium, Zofran, Protonix and Phenergan. Her nausea has improved and she's had no further episodes of emesis or diarrhea for at least 4 hours. Her abdominal pain has also resolved and she is tolerating water. Unfortunately around noon she spiked a  temperature to 102.1 which has subsequently decreased to 100.7. While febrile she's had heart rates in the 120 range. She is maintaining O2 saturations above 90% on room air. EDP discussed the case with general surgery who found no acute surgical needs at this time.    HOSPITAL COURSE:  Gastroenteritis, acute Just one episode of diarrhea this morning, patient has minimal nausea, no vomiting, advanced diet to soft diet -Differential includes food poisoning/foodborne illness versus gastroenteritis Initially started on Cipro and Flagyl IV C. difficile negative Stool studies no growth so far Patient symptomatically better and anticipate discharge home today    Fever/ Leukocytosis -Suspect related to acute gastroenteritis either viral or bacterial in etiology   Abdominal pain, acute -Currently has resolved -Likely secondary to gastroenteritis -CT abdomen and pelvis  unremarkable except for solitary gallstone, incidental finding    Acute hypokalemia -Secondary to emesis and diarrhea Repleted   Tachycardia -Improved with IV hydration  Discharge Exam: * Blood pressure 102/58, pulse 58, temperature 99 F (37.2 C), temperature source Oral, resp. rate 16, height _0  (1.676 m), weight 54.023 kg (119 lb 1.6 oz), last menstrual period 09/24/2014, SpO2 100 %.  General: In no acute distress, does not appear toxic and is reporting able to tolerate water  Psych: Normal affect, Denies Suicidal or Homicidal ideations, Awake Alert, Oriented X 3. Speech and thought patterns are clear and appropriate, no apparent short term memory deficits  Neuro: No focal neurological deficits, CN II through XII intact, Strength 5/5 all 4 extremities, Sensation intact all 4 extremities.  ENT: Ears and Eyes appear Normal, Conjunctivae clear, PER. Moist oral mucosa without erythema or exudates.  Neck: Supple, No lymphadenopathy appreciated  Respiratory: Symmetrical chest wall movement, Good air movement bilaterally, CTAB. Room Air  Cardiac: RRR, No Murmurs, no LE edema noted, no JVD, No carotid bruits, peripheral pulses palpable at 2+  Abdomen: Positive bowel sounds, Soft, Non tender, Non distended, No masses appreciated, no obvious hepatosplenomegaly  Skin: No Cyanosis, Normal Skin Turgor, No Skin Rash or Bruise.  Extremities: Symmetrical without obvious trauma or injury, no effusions         Follow-up Information    Schedule an appointment as soon as possible for a visit to follow up.      Follow up with Mauricio Po, [place]. Schedule an appointment as soon as possible for a visit in 3 days.   Specialty:  Family Medicine   Contact information:   [address] [place] [address] [phone]       Signed: Reyne Dumas 10/21/2014, 10:15 AM        Time spent >45 mins

## 2014-10-22 LAB — GI PATHOGEN PANEL BY PCR, STOOL
C DIFFICILE TOXIN A/B: NOT DETECTED
Campylobacter by PCR: NOT DETECTED
Cryptosporidium by PCR: NOT DETECTED
E COLI 0157 BY PCR: NOT DETECTED
E coli (ETEC) LT/ST: NOT DETECTED
E coli (STEC): NOT DETECTED
G lamblia by PCR: NOT DETECTED
NOROVIRUS G1/G2: NOT DETECTED
Rotavirus A by PCR: NOT DETECTED
SALMONELLA BY PCR: NOT DETECTED
Shigella by PCR: NOT DETECTED

## 2014-10-26 ENCOUNTER — Inpatient Hospital Stay: Payer: BLUE CROSS/BLUE SHIELD | Admitting: Family

## 2014-10-26 ENCOUNTER — Telehealth: Payer: Self-pay | Admitting: Family

## 2014-10-26 NOTE — Telephone Encounter (Signed)
Patient no showed for hospital fu 7/12.  Please advise.

## 2014-10-27 NOTE — Telephone Encounter (Signed)
Can reschedule if pt calls back. Has 2 more no shows

## 2014-10-27 NOTE — Telephone Encounter (Signed)
noted 

## 2015-11-19 ENCOUNTER — Ambulatory Visit (HOSPITAL_COMMUNITY)
Admission: EM | Admit: 2015-11-19 | Discharge: 2015-11-19 | Disposition: A | Discharge status: Home / Self Care | Payer: BLUE CROSS/BLUE SHIELD | Attending: Emergency Medicine | Admitting: Emergency Medicine

## 2015-11-19 ENCOUNTER — Encounter (HOSPITAL_COMMUNITY): Payer: Self-pay | Admitting: Emergency Medicine

## 2015-11-19 DIAGNOSIS — K029 Dental caries, unspecified: Secondary | ICD-10-CM

## 2015-11-19 MED ORDER — AMOXICILLIN 500 MG PO CAPS: 500.0000 mg | ORAL_CAPSULE | Freq: Three times a day (TID) | ORAL | 0 refills | Status: DC

## 2015-11-19 MED ORDER — OXYCODONE-ACETAMINOPHEN 10-325 MG PO TABS: 1.0000 | ORAL_TABLET | ORAL | 0 refills | Status: DC | PRN

## 2015-11-19 NOTE — ED Provider Notes (Signed)
CSN: [id number]     Arrival date & time 11/19/15  1938 History   First MD Initiated Contact with Patient 11/19/15 1951     Chief Complaint  Patient presents with  . Dental Pain   (Consider location/radiation/quality/duration/timing/severity/associated sxs/prior Treatment) HPI Patient is a 25 year old female with a three-day history of dental pain. She states that she cracked a tooth about 3 days ago was seen by a dentist on Wednesday or Thursday prescription for clindamycin which she states is causing her to vomit and Norco which is not working. She is here requesting a new antibiotic and additional pain medicine. Past Medical History:  Diagnosis Date  . Daily headache   . Depression   . GERD (gastroesophageal reflux disease)   . Sickle cell trait (HCC)   . UTI (lower urinary tract infection) ~ 02/2014   "not recurrent"   Past Surgical History:  Procedure Laterality Date  . NO PAST SURGERIES     Family History  Problem Relation Age of Onset  . Healthy Mother   . Arthritis Maternal Grandmother   . Stroke Maternal Grandfather   . Hypertension Maternal Grandfather    Social History  Substance Use Topics  . Smoking status: Never Smoker  . Smokeless tobacco: Never Used  . Alcohol use Yes     Comment: 10/20/2014 "might have 2 glasses of wine once/nmonth"   OB History    No data available     Review of Systems  Denies: HEADACHE, NAUSEA, ABDOMINAL PAIN, CHEST PAIN, CONGESTION, DYSURIA, SHORTNESS OF BREATH  Allergies  Review of patient's allergies indicates no known allergies.  Home Medications   Prior to Admission medications   Medication Sig Start Date End Date Taking? Authorizing Provider  amoxicillin (AMOXIL) 500 MG capsule Take 1 capsule (500 mg total) by mouth 3 (three) times daily. 11/19/15   Tharon Aquas, PA  ondansetron (ZOFRAN) 4 MG tablet Take 1 tablet (4 mg total) by mouth every 6 (six) hours as needed for nausea. 10/21/14   Richarda Overlie, MD   oxyCODONE-acetaminophen (PERCOCET) 10-325 MG tablet Take 1 tablet by mouth every 4 (four) hours as needed for pain. 11/19/15   Tharon Aquas, PA   Meds Ordered and Administered this Visit  Medications - No data to display  BP 124/88 (BP Location: Right Arm)   Pulse 67   Temp 98.6 F (37 C) (Oral)   LMP 11/06/2015 (Exact Date)   SpO2 100%  No data found.   Physical Exam NURSES NOTES AND VITAL SIGNS REVIEWED. CONSTITUTIONAL: Well developed, well nourished, no acute distress HEENT: normocephalic, atraumatic tooth #28 the molar has a small crack in cavity noted. Pulp is not exposed. There is no palpable or visible abscess noted. EYES: Conjunctiva normal NECK:normal ROM, supple, no adenopathy PULMONARY:No respiratory distress, normal effort ABDOMINAL: Soft, ND, NT BS+, No CVAT MUSCULOSKELETAL: Normal ROM of all extremities,  SKIN: warm and dry without rash PSYCHIATRIC: Mood and affect, behavior are normal   Urgent Care Course   Clinical Course    Procedures (including critical care time)  Labs Review Labs Reviewed - No data to display  Imaging Review No results found.   Visual Acuity Review  Right Eye Distance:   Left Eye Distance:   Bilateral Distance:    Right Eye Near:   Left Eye Near:    Bilateral Near:        Prescription changed to amoxicillin and Percocet for pain altercation is advised she needs to contact dentist for definitive care  and treatment. Review of [place] prescription monitoring system and does not reveal any concerns. MDM   1. Pain due to dental caries     Patient is reassured that there are no issues that require transfer to higher level of care at this time or additional tests. Patient is advised to continue home symptomatic treatment. Patient is advised that if there are new or worsening symptoms to attend the emergency department, contact primary care provider, or return to UC. Instructions of care provided discharged home in  stable condition.    THIS NOTE WAS GENERATED USING A VOICE RECOGNITION SOFTWARE PROGRAM. ALL REASONABLE EFFORTS  WERE MADE TO PROOFREAD THIS DOCUMENT FOR ACCURACY.  I have verbally reviewed the discharge instructions with the patient. A printed AVS was given to the patient.  All questions were answered prior to discharge.      Tharon Aquas, PA 11/19/15 2052

## 2015-11-19 NOTE — Discharge Instructions (Signed)
SEE YOUR DENTIST FOR FOLLOW  UP  STOP CLINDAMYCIN TAKE AMOXIL

## 2015-11-19 NOTE — ED Triage Notes (Signed)
The patient presented to the [facility] with a complaint of dental pain x 4 days. The patient was evaluated by her dentist and placed on clindamycin and norco for pain. The patient stated that she could not take the antibiotic due to nausea and the norco did not help the pain.

## 2016-10-11 ENCOUNTER — Inpatient Hospital Stay (HOSPITAL_COMMUNITY)
Admission: AD | Admit: 2016-10-11 | Discharge: 2016-10-11 | Disposition: A | Discharge status: Home / Self Care | Payer: Medicaid Other | Source: Ambulatory Visit | Attending: Obstetrics & Gynecology | Admitting: Obstetrics & Gynecology

## 2016-10-11 ENCOUNTER — Ambulatory Visit (HOSPITAL_COMMUNITY)
Admission: EM | Admit: 2016-10-11 | Discharge: 2016-10-11 | Disposition: A | Discharge status: Home / Self Care | Payer: Medicaid Other

## 2016-10-11 ENCOUNTER — Inpatient Hospital Stay (HOSPITAL_COMMUNITY): Payer: Medicaid Other

## 2016-10-11 ENCOUNTER — Encounter (HOSPITAL_COMMUNITY): Payer: Self-pay

## 2016-10-11 DIAGNOSIS — R102 Pelvic and perineal pain: Secondary | ICD-10-CM | POA: Diagnosis not present

## 2016-10-11 DIAGNOSIS — Z3A01 Less than 8 weeks gestation of pregnancy: Secondary | ICD-10-CM

## 2016-10-11 DIAGNOSIS — O99611 Diseases of the digestive system complicating pregnancy, first trimester: Secondary | ICD-10-CM | POA: Insufficient documentation

## 2016-10-11 DIAGNOSIS — Z8249 Family history of ischemic heart disease and other diseases of the circulatory system: Secondary | ICD-10-CM | POA: Insufficient documentation

## 2016-10-11 DIAGNOSIS — Z79899 Other long term (current) drug therapy: Secondary | ICD-10-CM | POA: Insufficient documentation

## 2016-10-11 DIAGNOSIS — O26891 Other specified pregnancy related conditions, first trimester: Secondary | ICD-10-CM | POA: Diagnosis not present

## 2016-10-11 DIAGNOSIS — K219 Gastro-esophageal reflux disease without esophagitis: Secondary | ICD-10-CM | POA: Diagnosis not present

## 2016-10-11 DIAGNOSIS — O9989 Other specified diseases and conditions complicating pregnancy, childbirth and the puerperium: Secondary | ICD-10-CM

## 2016-10-11 DIAGNOSIS — O209 Hemorrhage in early pregnancy, unspecified: Secondary | ICD-10-CM | POA: Diagnosis not present

## 2016-10-11 DIAGNOSIS — Z823 Family history of stroke: Secondary | ICD-10-CM | POA: Diagnosis not present

## 2016-10-11 DIAGNOSIS — Z8261 Family history of arthritis: Secondary | ICD-10-CM | POA: Diagnosis not present

## 2016-10-11 DIAGNOSIS — Z3490 Encounter for supervision of normal pregnancy, unspecified, unspecified trimester: Secondary | ICD-10-CM

## 2016-10-11 DIAGNOSIS — D573 Sickle-cell trait: Secondary | ICD-10-CM | POA: Diagnosis not present

## 2016-10-11 DIAGNOSIS — R109 Unspecified abdominal pain: Secondary | ICD-10-CM

## 2016-10-11 DIAGNOSIS — O99341 Other mental disorders complicating pregnancy, first trimester: Secondary | ICD-10-CM | POA: Diagnosis not present

## 2016-10-11 DIAGNOSIS — F329 Major depressive disorder, single episode, unspecified: Secondary | ICD-10-CM | POA: Diagnosis not present

## 2016-10-11 LAB — WET PREP, GENITAL
CLUE CELLS WET PREP: NONE SEEN
Sperm: NONE SEEN
Trich, Wet Prep: NONE SEEN
YEAST WET PREP: NONE SEEN

## 2016-10-11 LAB — URINALYSIS, ROUTINE W REFLEX MICROSCOPIC
Bilirubin Urine: NEGATIVE
GLUCOSE, UA: NEGATIVE mg/dL
HGB URINE DIPSTICK: NEGATIVE
Ketones, ur: NEGATIVE mg/dL
Leukocytes, UA: NEGATIVE
Nitrite: NEGATIVE
PH: 7 (ref 5.0–8.0)
Protein, ur: NEGATIVE mg/dL
Specific Gravity, Urine: 1.023 (ref 1.005–1.030)

## 2016-10-11 LAB — CBC
HEMATOCRIT: 33.8 % — AB (ref 36.0–46.0)
HEMOGLOBIN: 11.5 g/dL — AB (ref 12.0–15.0)
MCH: 24.3 pg — ABNORMAL LOW (ref 26.0–34.0)
MCHC: 34 g/dL (ref 30.0–36.0)
MCV: 71.3 fL — ABNORMAL LOW (ref 78.0–100.0)
Platelets: 252 10*3/uL (ref 150–400)
RBC: 4.74 MIL/uL (ref 3.87–5.11)
RDW: 21.9 % — ABNORMAL HIGH (ref 11.5–15.5)
WBC: 8.9 10*3/uL (ref 4.0–10.5)

## 2016-10-11 LAB — POCT PREGNANCY, URINE: Preg Test, Ur: POSITIVE — AB

## 2016-10-11 LAB — HCG, QUANTITATIVE, PREGNANCY: HCG, BETA CHAIN, QUANT, S: 24476 m[IU]/mL — AB (ref <5)

## 2016-10-11 NOTE — MAU Provider Note (Signed)
History     CSN: [id number]  Arrival date and time: 10/11/16 1910   First Provider Initiated Contact with Patient 10/11/16 2059      Chief Complaint  Patient presents with  . Pelvic Pain   Pelvic Pain  The patient's primary symptoms include pelvic pain. The patient's pertinent negatives include no vaginal discharge. This is a new problem. The current episode started yesterday. The problem occurs intermittently. The problem has been waxing and waning. Pain severity now: 3/10. The problem affects both sides. She is pregnant. Pertinent negatives include no chills, dysuria, fever, frequency, nausea, urgency or vomiting. The vaginal discharge was brown. The vaginal bleeding is spotting (had spotting 2 days but none since. ). She has not been passing clots. She has not been passing tissue. Nothing aggravates the symptoms. She has tried nothing for the symptoms. She uses nothing for contraception. Her menstrual history has been regular (LMP 09/02/16 ).    Past Medical History:  Diagnosis Date  . Daily headache   . Depression   . GERD (gastroesophageal reflux disease)   . Sickle cell trait (HCC)   . UTI (lower urinary tract infection) ~ 02/2014   "not recurrent"    Past Surgical History:  Procedure Laterality Date  . NO PAST SURGERIES      Family History  Problem Relation Age of Onset  . Healthy Mother   . Arthritis Maternal Grandmother   . Stroke Maternal Grandfather   . Hypertension Maternal Grandfather     Social History  Substance Use Topics  . Smoking status: Never Smoker  . Smokeless tobacco: Never Used  . Alcohol use Yes     Comment: 10/20/2014 "might have 2 glasses of wine once/nmonth"    Allergies: No Known Allergies  Prescriptions Prior to Admission  Medication Sig Dispense Refill Last Dose  . acetaminophen (TYLENOL) 325 MG tablet Take 650 mg by mouth every 6 (six) hours as needed for headache.   Past Week at Unknown time  . Prenatal Vit-Fe Fumarate-FA (PRENATAL  MULTIVITAMIN) TABS tablet Take 1 tablet by mouth daily at 12 noon.   10/11/2016 at Unknown time  . amoxicillin (AMOXIL) 500 MG capsule Take 1 capsule (500 mg total) by mouth 3 (three) times daily. (Patient not taking: Reported on 10/11/2016) 21 capsule 0 Not Taking at Unknown time  . ondansetron (ZOFRAN) 4 MG tablet Take 1 tablet (4 mg total) by mouth every 6 (six) hours as needed for nausea. (Patient not taking: Reported on 10/11/2016) 30 tablet 0 Not Taking at Unknown time  . oxyCODONE-acetaminophen (PERCOCET) 10-325 MG tablet Take 1 tablet by mouth every 4 (four) hours as needed for pain. (Patient not taking: Reported on 10/11/2016) 30 tablet 0 Not Taking at Unknown time    Review of Systems  Constitutional: Negative for chills and fever.  Gastrointestinal: Negative for nausea and vomiting.  Genitourinary: Positive for pelvic pain and vaginal bleeding. Negative for dysuria, frequency, urgency and vaginal discharge.   Physical Exam   Blood pressure 113/69, pulse 87, temperature 98.9 F (37.2 C), temperature source Oral, resp. rate 17, height 5' 6.5" (1.689 m), weight 117 lb 12 oz (53.4 kg), last menstrual period 09/02/2016, SpO2 99 %.  Physical Exam  Nursing note and vitals reviewed. Constitutional: She is oriented to person, place, and time. She appears well-developed and well-nourished. No distress.  HENT:  Head: Normocephalic.  Cardiovascular: Normal rate.   Respiratory: Effort normal.  GI: Soft. There is no tenderness.  Neurological: She is alert and oriented  to person, place, and time.  Skin: Skin is warm and dry.  Psychiatric: She has a normal mood and affect.   Results for orders placed or performed during the hospital encounter of 10/11/16 (from the past 24 hour(s))  Urinalysis, Routine w reflex microscopic     Status: None   Collection Time: 10/11/16  7:39 PM  Result Value Ref Range   Color, Urine YELLOW YELLOW   APPearance CLEAR CLEAR   Specific Gravity, Urine 1.023 1.005 -  1.030   pH 7.0 5.0 - 8.0   Glucose, UA NEGATIVE NEGATIVE mg/dL   Hgb urine dipstick NEGATIVE NEGATIVE   Bilirubin Urine NEGATIVE NEGATIVE   Ketones, ur NEGATIVE NEGATIVE mg/dL   Protein, ur NEGATIVE NEGATIVE mg/dL   Nitrite NEGATIVE NEGATIVE   Leukocytes, UA NEGATIVE NEGATIVE  Pregnancy, urine POC     Status: Abnormal   Collection Time: 10/11/16  8:10 PM  Result Value Ref Range   Preg Test, Ur POSITIVE (A) NEGATIVE  CBC     Status: Abnormal   Collection Time: 10/11/16  9:20 PM  Result Value Ref Range   WBC 8.9 4.0 - 10.5 K/uL   RBC 4.74 3.87 - 5.11 MIL/uL   Hemoglobin 11.5 (L) 12.0 - 15.0 g/dL   HCT 16.1 (L) 09.6 - 04.5 %   MCV 71.3 (L) 78.0 - 100.0 fL   MCH 24.3 (L) 26.0 - 34.0 pg   MCHC 34.0 30.0 - 36.0 g/dL   RDW 40.9 (H) 81.1 - 91.4 %   Platelets 252 150 - 400 K/uL  hCG, quantitative, pregnancy     Status: Abnormal   Collection Time: 10/11/16  9:20 PM  Result Value Ref Range   hCG, Beta Chain, Quant, S 24,476 (H) <5 mIU/mL  ABO/Rh     Status: None (Preliminary result)   Collection Time: 10/11/16  9:20 PM  Result Value Ref Range   ABO/RH(D) O POS   Wet prep, genital     Status: Abnormal   Collection Time: 10/11/16 10:15 PM  Result Value Ref Range   Yeast Wet Prep HPF POC NONE SEEN NONE SEEN   Trich, Wet Prep NONE SEEN NONE SEEN   Clue Cells Wet Prep HPF POC NONE SEEN NONE SEEN   WBC, Wet Prep HPF POC FEW (A) NONE SEEN   Sperm NONE SEEN    US Ob Comp Less 14 Wks  Result Date: 10/11/2016 CLINICAL DATA:  Pelvic pain for 2 days EXAM: OBSTETRIC <14 WK [place] AND TRANSVAGINAL OB US TECHNIQUE: Both transabdominal and transvaginal ultrasound examinations were performed for complete evaluation of the gestation as well as the maternal uterus, adnexal regions, and pelvic cul-de-sac. Transvaginal technique was performed to assess early pregnancy. COMPARISON:  None. FINDINGS: Intrauterine gestational sac: Single intrauterine gestation Yolk sac:  Visualized Embryo:  Visualized Cardiac  Activity: Visualized Heart Rate: 89  bpm CRL:  2.7  mm   5 w   5 d                  [place] EDC: 06/08/2017 Subchorionic hemorrhage:  None visualized. Maternal uterus/adnexae: Ovaries are within normal limits. Left ovary measures 2.6 x 1.7 x 1.9 cm. The right ovary measures 3.8 x 2.7 by 2.9 cm. Probable corpus luteal cyst in the right ovary measuring 1.9 x 1.4 x 1.7 cm. No free fluid. IMPRESSION: Single viable intrauterine pregnancy as above. No other specific abnormalities are visualized. Electronically Signed   By: Jasmine Pang M.D.   On: 10/11/2016 22:07  [place] Ob Transvaginal  Result Date: 10/11/2016 CLINICAL DATA:  Pelvic pain for 2 days EXAM: OBSTETRIC <14 WK US AND TRANSVAGINAL OB US TECHNIQUE: Both transabdominal and transvaginal ultrasound examinations were performed for complete evaluation of the gestation as well as the maternal uterus, adnexal regions, and pelvic cul-de-sac. Transvaginal technique was performed to assess early pregnancy. COMPARISON:  None. FINDINGS: Intrauterine gestational sac: Single intrauterine gestation Yolk sac:  Visualized Embryo:  Visualized Cardiac Activity: Visualized Heart Rate: 89  bpm CRL:  2.7  mm   5 w   5 d                  US EDC: 06/08/2017 Subchorionic hemorrhage:  None visualized. Maternal uterus/adnexae: Ovaries are within normal limits. Left ovary measures 2.6 x 1.7 x 1.9 cm. The right ovary measures 3.8 x 2.7 by 2.9 cm. Probable corpus luteal cyst in the right ovary measuring 1.9 x 1.4 x 1.7 cm. No free fluid. IMPRESSION: Single viable intrauterine pregnancy as above. No other specific abnormalities are visualized. Electronically Signed   By: Jasmine PangKim  Fujinaga M.D.   On: 10/11/2016 22:07    MAU Course  Procedures  MDM   Assessment and Plan   1. [redacted] weeks gestation of pregnancy   2. Pelvic pain in pregnancy, antepartum, first trimester   3. Intrauterine pregnancy    DC home Comfort measures reviewed  1st Trimester precautions  Bleeding precautions RX:  none  Return to MAU as needed FU with OB as planned  Follow-up Information    [facility]   Contact information: [address] [place] [id number] [phone]            Thressa ShellerHeather Maurion Walkowiak 10/11/2016, 10:35 PM

## 2016-10-11 NOTE — MAU Note (Signed)
Pt [facility]. Pt states she had some brown spotting two days ago. Pt states she has felt some cramping for the last two days. Pt states she thought the cramping would go away but it has been consistent. Pt states her LMP was 09/02/16.

## 2016-10-11 NOTE — Discharge Instructions (Signed)
Prenatal Care Providers °[facility]    [facility] ° Phone- [phone]     Phone: [phone] °         °[facility]                      [facility] ° @[place]     Phone: [phone] ° Phone: [phone] °         Family [facility] Center °[facility]     Phone: [phone] ° Phone: [phone]   °        [facility] °[facility] [place]                hone: [phone] ° Phone: [phone] °        [facility] °Dr. Bernard Marshall      Phone: [phone] ° Phone: [phone] °        [facility] °[facility]                Phone: [phone] ° Women’s Health  ° Phone:[phone]    [facility] ([place]) °         Phone: [phone] °[facility] °  Phone: [phone] °Safe Medications in Pregnancy  ° °Acne: °Benzoyl Peroxide °Salicylic Acid ° °Backache/Headache: °Tylenol: 2 regular strength every 4 hours OR °             2 Extra strength every 6 hours ° °Colds/Coughs/Allergies: °Benadryl (alcohol free) 25 mg every 6 hours as needed °Breath right strips °Claritin °Cepacol throat lozenges °Chloraseptic throat spray °Cold-Eeze- up to three times per day °Cough drops, alcohol free °Flonase (by prescription only) °Guaifenesin °Mucinex °Robitussin DM (plain only, alcohol free) °Saline nasal spray/drops °Sudafed (pseudoephedrine) & Actifed ** use only after [redacted] weeks gestation and if you do not have high blood pressure °Tylenol °Vicks Vaporub °Zinc lozenges °Zyrtec  ° °Constipation: °Colace °Ducolax suppositories °Fleet enema °Glycerin suppositories °Metamucil °Milk of magnesia °Miralax °Senokot °Smooth move tea ° °Diarrhea: °Kaopectate °Imodium A-D ° °*NO pepto Bismol ° °Hemorrhoids: °Anusol °Anusol HC °Preparation H °Tucks ° °Indigestion: °Tums °Maalox °Mylanta °Zantac  °Pepcid ° °Insomnia: °Benadryl (alcohol free) 25mg every 6 hours as needed °Tylenol  PM °Unisom, no Gelcaps ° °Leg Cramps: °Tums °MagGel ° °Nausea/Vomiting:  °Bonine °Dramamine °Emetrol °Ginger extract °Sea bands °Meclizine  °Nausea medication to take during pregnancy:  °Unisom (doxylamine succinate 25 mg tablets) Take one tablet daily at bedtime. If symptoms are not adequately controlled, the dose can be increased to a maximum recommended dose of two tablets daily (1/2 tablet in the morning, 1/2 tablet mid-afternoon and one at bedtime). °Vitamin B6 100mg tablets. Take one tablet twice a day (up to 200 mg per day). ° °Skin Rashes: °Aveeno products °Benadryl cream or 25mg every 6 hours as needed °Calamine Lotion °1% cortisone cream ° °Yeast infection: °Gyne-lotrimin 7 °Monistat 7 ° ° °**If taking multiple medications, please check labels to avoid duplicating the same active ingredients °**take medication as directed on the label °** Do not exceed 4000 mg of tylenol in 24 hours °**Do not take medications that contain aspirin or ibuprofen ° ° ° ° °First Trimester of Pregnancy °The first trimester of pregnancy is from week 1 until the end of week 13 (months 1 through 3). A week after a sperm fertilizes an egg, the egg will implant on the wall of the uterus. This   embryo will begin to develop into a baby. Genes from you and your partner will form the baby. The female genes will determine whether the baby will be a boy or a girl. At 6-8 weeks, the eyes and face will be formed, and the heartbeat can be seen on ultrasound. At the end of 12 weeks, all the baby's organs will be formed. °Now that you are pregnant, you will want to do everything you can to have a healthy baby. Two of the most important things are to get good prenatal care and to follow your health care provider's instructions. Prenatal care is all the medical care you receive before the baby's birth. This care will help prevent, find, and treat any problems during the pregnancy and childbirth. °Body changes during your first trimester °Your body  goes through many changes during pregnancy. The changes vary from woman to woman. °· You may gain or lose a couple of pounds at first. °· You may feel sick to your stomach (nauseous) and you may throw up (vomit). If the vomiting is uncontrollable, call your health care provider. °· You may tire easily. °· You may develop headaches that can be relieved by medicines. All medicines should be approved by your health care provider. °· You may urinate more often. Painful urination may mean you have a bladder infection. °· You may develop heartburn as a result of your pregnancy. °· You may develop constipation because certain hormones are causing the muscles that push stool through your intestines to slow down. °· You may develop hemorrhoids or swollen veins (varicose veins). °· Your breasts may begin to grow larger and become tender. Your nipples may stick out more, and the tissue that surrounds them (areola) may become darker. °· Your gums may bleed and may be sensitive to brushing and flossing. °· Dark spots or blotches (chloasma, mask of pregnancy) may develop on your face. This will likely fade after the baby is born. °· Your menstrual periods will stop. °· You may have a loss of appetite. °· You may develop cravings for certain kinds of food. °· You may have changes in your emotions from day to day, such as being excited to be pregnant or being concerned that something may go wrong with the pregnancy and baby. °· You may have more vivid and strange dreams. °· You may have changes in your hair. These can include thickening of your hair, rapid growth, and changes in texture. Some women also have hair loss during or after pregnancy, or hair that feels dry or thin. Your hair will most likely return to normal after your baby is born. ° °What to expect at prenatal visits °During a routine prenatal visit: °· You will be weighed to make sure you and the baby are growing normally. °· Your blood pressure will be taken. °· Your  abdomen will be measured to track your baby's growth. °· The fetal heartbeat will be listened to between weeks 10 and 14 of your pregnancy. °· Test results from any previous visits will be discussed. ° °Your health care provider may ask you: °· How you are feeling. °· If you are feeling the baby move. °· If you have had any abnormal symptoms, such as leaking fluid, bleeding, severe headaches, or abdominal cramping. °· If you are using any tobacco products, including cigarettes, chewing tobacco, and electronic cigarettes. °· If you have any questions. ° °Other tests that may be performed during your first trimester include: °· Blood tests to find   your blood type and to check for the presence of any previous infections. The tests will also be used to check for low iron levels (anemia) and protein on red blood cells (Rh antibodies). Depending on your risk factors, or if you previously had diabetes during pregnancy, you may have tests to check for high blood sugar that affects pregnant women (gestational diabetes). °· Urine tests to check for infections, diabetes, or protein in the urine. °· An ultrasound to confirm the proper growth and development of the baby. °· Fetal screens for spinal cord problems (spina bifida) and Down syndrome. °· HIV (human immunodeficiency virus) testing. Routine prenatal testing includes screening for HIV, unless you choose not to have this test. °· You may need other tests to make sure you and the baby are doing well. ° °Follow these instructions at home: °Medicines °· Follow your health care provider's instructions regarding medicine use. Specific medicines may be either safe or unsafe to take during pregnancy. °· Take a prenatal vitamin that contains at least 600 micrograms (mcg) of folic acid. °· If you develop constipation, try taking a stool softener if your health care provider approves. °Eating and drinking °· Eat a balanced diet that includes fresh fruits and vegetables, whole  grains, good sources of protein such as meat, eggs, or tofu, and low-fat dairy. Your health care provider will help you determine the amount of weight gain that is right for you. °· Avoid raw meat and uncooked cheese. These carry germs that can cause birth defects in the baby. °· Eating four or five small meals rather than three large meals a day may help relieve nausea and vomiting. If you start to feel nauseous, eating a few soda crackers can be helpful. Drinking liquids between meals, instead of during meals, also seems to help ease nausea and vomiting. °· Limit foods that are high in fat and processed sugars, such as fried and sweet foods. °· To prevent constipation: °? Eat foods that are high in fiber, such as fresh fruits and vegetables, whole grains, and beans. °? Drink enough fluid to keep your urine clear or pale yellow. °Activity °· Exercise only as directed by your health care provider. Most women can continue their usual exercise routine during pregnancy. Try to exercise for 30 minutes at least 5 days a week. Exercising will help you: °? Control your weight. °? Stay in shape. °? Be prepared for labor and delivery. °· Experiencing pain or cramping in the lower abdomen or lower back is a good sign that you should stop exercising. Check with your health care provider before continuing with normal exercises. °· Try to avoid standing for long periods of time. Move your legs often if you must stand in one place for a long time. °· Avoid heavy lifting. °· Wear low-heeled shoes and practice good posture. °· You may continue to have sex unless your health care provider tells you not to. °Relieving pain and discomfort °· Wear a good support bra to relieve breast tenderness. °· Take warm sitz baths to soothe any pain or discomfort caused by hemorrhoids. Use hemorrhoid cream if your health care provider approves. °· Rest with your legs elevated if you have leg cramps or low back pain. °· If you develop varicose  veins in your legs, wear support hose. Elevate your feet for 15 minutes, 3-4 times a day. Limit salt in your diet. °Prenatal care °· Schedule your prenatal visits by the twelfth week of pregnancy. They are usually scheduled monthly   at first, then more often in the last 2 months before delivery. °· Write down your questions. Take them to your prenatal visits. °· Keep all your prenatal visits as told by your health care provider. This is important. °Safety °· Wear your seat belt at all times when driving. °· Make a list of emergency phone numbers, including numbers for family, friends, the hospital, and police and fire departments. °General instructions °· Ask your health care provider for a referral to a local prenatal education class. Begin classes no later than the beginning of month 6 of your pregnancy. °· Ask for help if you have counseling or nutritional needs during pregnancy. Your health care provider can offer advice or refer you to specialists for help with various needs. °· Do not use hot tubs, steam rooms, or saunas. °· Do not douche or use tampons or scented sanitary pads. °· Do not cross your legs for long periods of time. °· Avoid cat litter boxes and soil used by cats. These carry germs that can cause birth defects in the baby and possibly loss of the fetus by miscarriage or stillbirth. °· Avoid all smoking, herbs, alcohol, and medicines not prescribed by your health care provider. Chemicals in these products affect the formation and growth of the baby. °· Do not use any products that contain nicotine or tobacco, such as cigarettes and e-cigarettes. If you need help quitting, ask your health care provider. You may receive counseling support and other resources to help you quit. °· Schedule a dentist appointment. At home, brush your teeth with a soft toothbrush and be gentle when you floss. °Contact a health care provider if: °· You have dizziness. °· You have mild pelvic cramps, pelvic pressure, or  nagging pain in the abdominal area. °· You have persistent nausea, vomiting, or diarrhea. °· You have a bad smelling vaginal discharge. °· You have pain when you urinate. °· You notice increased swelling in your face, hands, legs, or ankles. °· You are exposed to fifth disease or chickenpox. °· You are exposed to German measles (rubella) and have never had it. °Get help right away if: °· You have a fever. °· You are leaking fluid from your vagina. °· You have spotting or bleeding from your vagina. °· You have severe abdominal cramping or pain. °· You have rapid weight gain or loss. °· You vomit blood or material that looks like coffee grounds. °· You develop a severe headache. °· You have shortness of breath. °· You have any kind of trauma, such as from a fall or a car accident. °Summary °· The first trimester of pregnancy is from week 1 until the end of week 13 (months 1 through 3). °· Your body goes through many changes during pregnancy. The changes vary from woman to woman. °· You will have routine prenatal visits. During those visits, your health care provider will examine you, discuss any test results you may have, and talk with you about how you are feeling. °This information is not intended to replace advice given to you by your health care provider. Make sure you discuss any questions you have with your health care provider. °Document Released: 03/27/2001 Document Revised: 03/14/2016 Document Reviewed: 03/14/2016 °Elsevier Interactive Patient Education © 2017 [employer] ° °

## 2016-10-12 LAB — ABO/RH: ABO/RH(D): O POS

## 2016-10-12 LAB — GC/CHLAMYDIA PROBE AMP (~~LOC~~) NOT AT ARMC
Chlamydia: NEGATIVE
NEISSERIA GONORRHEA: NEGATIVE

## 2016-10-12 LAB — HIV ANTIBODY (ROUTINE TESTING W REFLEX): HIV SCREEN 4TH GENERATION: NONREACTIVE

## 2016-10-12 LAB — RPR: RPR Ser Ql: NONREACTIVE

## 2017-03-20 ENCOUNTER — Ambulatory Visit: Payer: Self-pay | Admitting: Registered"

## 2017-04-20 ENCOUNTER — Encounter (HOSPITAL_COMMUNITY): Payer: Self-pay

## 2017-04-20 ENCOUNTER — Observation Stay (HOSPITAL_COMMUNITY)
Admission: AD | Admit: 2017-04-20 | Discharge: 2017-04-22 | Disposition: A | Discharge status: Home / Self Care | Payer: Medicaid Other | Source: Ambulatory Visit | Attending: Obstetrics and Gynecology | Admitting: Obstetrics and Gynecology

## 2017-04-20 ENCOUNTER — Inpatient Hospital Stay (HOSPITAL_COMMUNITY): Payer: Medicaid Other

## 2017-04-20 DIAGNOSIS — O9989 Other specified diseases and conditions complicating pregnancy, childbirth and the puerperium: Principal | ICD-10-CM | POA: Insufficient documentation

## 2017-04-20 DIAGNOSIS — O26899 Other specified pregnancy related conditions, unspecified trimester: Secondary | ICD-10-CM

## 2017-04-20 DIAGNOSIS — O26843 Uterine size-date discrepancy, third trimester: Secondary | ICD-10-CM | POA: Insufficient documentation

## 2017-04-20 DIAGNOSIS — R1031 Right lower quadrant pain: Secondary | ICD-10-CM | POA: Insufficient documentation

## 2017-04-20 DIAGNOSIS — O219 Vomiting of pregnancy, unspecified: Secondary | ICD-10-CM | POA: Insufficient documentation

## 2017-04-20 DIAGNOSIS — O26893 Other specified pregnancy related conditions, third trimester: Secondary | ICD-10-CM | POA: Diagnosis present

## 2017-04-20 DIAGNOSIS — Z3689 Encounter for other specified antenatal screening: Secondary | ICD-10-CM | POA: Diagnosis not present

## 2017-04-20 DIAGNOSIS — Z3A33 33 weeks gestation of pregnancy: Secondary | ICD-10-CM | POA: Diagnosis not present

## 2017-04-20 DIAGNOSIS — R109 Unspecified abdominal pain: Secondary | ICD-10-CM

## 2017-04-20 DIAGNOSIS — O99613 Diseases of the digestive system complicating pregnancy, third trimester: Secondary | ICD-10-CM | POA: Diagnosis not present

## 2017-04-20 DIAGNOSIS — R112 Nausea with vomiting, unspecified: Secondary | ICD-10-CM

## 2017-04-20 DIAGNOSIS — K5900 Constipation, unspecified: Secondary | ICD-10-CM | POA: Diagnosis not present

## 2017-04-20 LAB — CBC WITH DIFFERENTIAL/PLATELET
BASOS ABS: 0 10*3/uL (ref 0.0–0.1)
BASOS PCT: 0 %
Eosinophils Absolute: 0.2 10*3/uL (ref 0.0–0.7)
Eosinophils Relative: 2 %
HEMATOCRIT: 31.1 % — AB (ref 36.0–46.0)
Hemoglobin: 10.6 g/dL — ABNORMAL LOW (ref 12.0–15.0)
Lymphocytes Relative: 14 %
Lymphs Abs: 1.5 10*3/uL (ref 0.7–4.0)
MCH: 25.4 pg — ABNORMAL LOW (ref 26.0–34.0)
MCHC: 34.1 g/dL (ref 30.0–36.0)
MCV: 74.4 fL — AB (ref 78.0–100.0)
MONO ABS: 0.7 10*3/uL (ref 0.1–1.0)
Monocytes Relative: 7 %
NEUTROS ABS: 8.3 10*3/uL — AB (ref 1.7–7.7)
Neutrophils Relative %: 77 %
Platelets: 200 10*3/uL (ref 150–400)
RBC: 4.18 MIL/uL (ref 3.87–5.11)
RDW: 21.7 % — AB (ref 11.5–15.5)
WBC: 10.7 10*3/uL — ABNORMAL HIGH (ref 4.0–10.5)

## 2017-04-20 LAB — URINALYSIS, ROUTINE W REFLEX MICROSCOPIC
Bilirubin Urine: NEGATIVE
Glucose, UA: NEGATIVE mg/dL
Hgb urine dipstick: NEGATIVE
Ketones, ur: 80 mg/dL — AB
LEUKOCYTES UA: NEGATIVE
Nitrite: NEGATIVE
PROTEIN: NEGATIVE mg/dL
pH: 8 (ref 5.0–8.0)

## 2017-04-20 LAB — COMPREHENSIVE METABOLIC PANEL
ALBUMIN: 3.6 g/dL (ref 3.5–5.0)
ALT: 16 U/L (ref 14–54)
ANION GAP: 11 (ref 5–15)
AST: 27 U/L (ref 15–41)
Alkaline Phosphatase: 136 U/L — ABNORMAL HIGH (ref 38–126)
BILIRUBIN TOTAL: 1.4 mg/dL — AB (ref 0.3–1.2)
BUN: 7 mg/dL (ref 6–20)
CHLORIDE: 106 mmol/L (ref 101–111)
CO2: 18 mmol/L — ABNORMAL LOW (ref 22–32)
Calcium: 8.9 mg/dL (ref 8.9–10.3)
Creatinine, Ser: 0.32 mg/dL — ABNORMAL LOW (ref 0.44–1.00)
GFR calc Af Amer: >60 mL/min (ref >60)
Glucose, Bld: 93 mg/dL (ref 65–99)
POTASSIUM: 3.8 mmol/L (ref 3.5–5.1)
Sodium: 135 mmol/L (ref 135–145)
TOTAL PROTEIN: 6.8 g/dL (ref 6.5–8.1)

## 2017-04-20 LAB — TYPE AND SCREEN
ABO/RH(D): O POS
ANTIBODY SCREEN: NEGATIVE

## 2017-04-20 MED ORDER — CALCIUM CARBONATE ANTACID 500 MG PO CHEW: 2.0000 | CHEWABLE_TABLET | ORAL | Status: DC | PRN

## 2017-04-20 MED ORDER — ACETAMINOPHEN 325 MG PO TABS
650.0000 mg | ORAL_TABLET | ORAL | Status: DC | PRN
  Administered 2017-04-22: 650 mg via ORAL
  Filled 2017-04-20: qty 2

## 2017-04-20 MED ORDER — BETAMETHASONE SOD PHOS & ACET 6 (3-3) MG/ML IJ SUSP
12.0000 mg | INTRAMUSCULAR | Status: AC
  Administered 2017-04-20 – 2017-04-21 (×2): 12 mg via INTRAMUSCULAR
  Filled 2017-04-20 (×2): qty 2

## 2017-04-20 MED ORDER — HYDROMORPHONE HCL 1 MG/ML IJ SOLN
0.5000 mg | Freq: Once | INTRAMUSCULAR | Status: AC
  Administered 2017-04-20: 0.5 mg via INTRAVENOUS
  Filled 2017-04-20: qty 1

## 2017-04-20 MED ORDER — DOCUSATE SODIUM 100 MG PO CAPS
100.0000 mg | ORAL_CAPSULE | Freq: Every day | ORAL | Status: DC
  Administered 2017-04-21 – 2017-04-22 (×2): 100 mg via ORAL
  Filled 2017-04-20 (×2): qty 1

## 2017-04-20 MED ORDER — LACTATED RINGERS IV BOLUS (SEPSIS)
1000.0000 mL | Freq: Once | INTRAVENOUS | Status: AC
  Administered 2017-04-20: 1000 mL via INTRAVENOUS

## 2017-04-20 MED ORDER — ZOLPIDEM TARTRATE 5 MG PO TABS
5.0000 mg | ORAL_TABLET | Freq: Every evening | ORAL | Status: DC | PRN
  Administered 2017-04-22: 5 mg via ORAL
  Filled 2017-04-20: qty 1

## 2017-04-20 MED ORDER — PROMETHAZINE HCL 25 MG/ML IJ SOLN
12.5000 mg | Freq: Four times a day (QID) | INTRAMUSCULAR | Status: DC | PRN
  Administered 2017-04-21: 12.5 mg via INTRAVENOUS
  Filled 2017-04-20: qty 1

## 2017-04-20 MED ORDER — POLYETHYLENE GLYCOL 3350 17 G PO PACK
17.0000 g | PACK | Freq: Every day | ORAL | Status: DC | PRN
  Filled 2017-04-20: qty 1

## 2017-04-20 MED ORDER — IOPAMIDOL (ISOVUE-300) INJECTION 61%
30.0000 mL | INTRAVENOUS | Status: AC
  Administered 2017-04-20: 30 mL via ORAL

## 2017-04-20 MED ORDER — HYDROMORPHONE HCL 1 MG/ML IJ SOLN
0.5000 mg | INTRAMUSCULAR | Status: DC | PRN
  Administered 2017-04-20 – 2017-04-21 (×6): 0.5 mg via INTRAVENOUS
  Filled 2017-04-20 (×6): qty 0.5

## 2017-04-20 MED ORDER — DEXTROSE IN LACTATED RINGERS 5 % IV SOLN
INTRAVENOUS | Status: DC
  Administered 2017-04-20 – 2017-04-21 (×2): via INTRAVENOUS

## 2017-04-20 MED ORDER — PROMETHAZINE HCL 25 MG/ML IJ SOLN
12.5000 mg | Freq: Once | INTRAMUSCULAR | Status: AC
  Administered 2017-04-20: 12.5 mg via INTRAVENOUS
  Filled 2017-04-20: qty 1

## 2017-04-20 MED ORDER — PRENATAL MULTIVITAMIN CH
1.0000 | ORAL_TABLET | Freq: Every day | ORAL | Status: DC
  Administered 2017-04-21 – 2017-04-22 (×2): 1 via ORAL
  Filled 2017-04-20 (×2): qty 1

## 2017-04-20 MED ORDER — IOPAMIDOL (ISOVUE-300) INJECTION 61%
100.0000 mL | Freq: Once | INTRAVENOUS | Status: AC | PRN
  Administered 2017-04-20: 100 mL via INTRAVENOUS

## 2017-04-20 MED ORDER — BISACODYL 10 MG RE SUPP
10.0000 mg | Freq: Every day | RECTAL | Status: DC | PRN
  Filled 2017-04-20: qty 1

## 2017-04-20 NOTE — Progress Notes (Signed)
26yo, G2P0, EGA [redacted]W[redacted]D, with EDC 2-24 presents with sudden onset RLQ pain about an hour ago while standing in kitchen.  Pain comes in waves but is there most of the time.  She had emesis at home and here.  Pain is worse when baby or she moves.  Pain started at the top of the uterus, moved along right side and radiating to her back.  No F/C.  No urinary or bowel issues.  Normal fetal movement, no LOF or VB.  Pregnancy uncomplicated except for Hgb L[place] and recent diagnosis of GDM-not checking sugars yet.  Afeb, VSS FHT- appropriate for EGA, ? Ctx Abd- soft, tender RLQ, no rebound, fundus itself is not tender but tender along right side, no CVAT  CBC with WBC 10.7 CMP normal  U/s without obvious source for pain, no evidence of abruption, nl AFV  IUP at 32 weeks with RLQ pain, possible appendicitis.  Will get CT scan and see if needs surgical consult.

## 2017-04-20 NOTE — Progress Notes (Signed)
Pt still having pain off and on CT scan without evidence of appendicitis or any other acute abnormality Will admit for pain control and observation since I am not sure of the source of her pain. Could be kidney stone, no urinalysis yet.  Will give steroids in case needs to deliver early, dilaudid prn pain.  Dr. Francine Gravenimaguila in NICU notified of preterm admission.

## 2017-04-20 NOTE — MAU Provider Note (Signed)
History   G2P0010 @ 32.6 wks in with onset of severe right side pain that started over 1 hour ago. Pain is intense,constant, and severe. pt has profuse vomiting on admit and very uncomfortable in the bed on admit. Denies ROM or vag bleeding.  CSN: [id number]  Arrival date & time 04/20/17  1356   None     Chief Complaint  Patient presents with  . Abdominal Pain  . Emesis    HPI  Past Medical History:  Diagnosis Date  . Daily headache   . Depression   . GERD (gastroesophageal reflux disease)   . Sickle cell trait (HCC)   . UTI (lower urinary tract infection) ~ 02/2014   "not recurrent"    Past Surgical History:  Procedure Laterality Date  . NO PAST SURGERIES      Family History  Problem Relation Age of Onset  . Healthy Mother   . Arthritis Maternal Grandmother   . Stroke Maternal Grandfather   . Hypertension Maternal Grandfather     Social History   Tobacco Use  . Smoking status: Never Smoker  . Smokeless tobacco: Never Used  Substance Use Topics  . Alcohol use: Yes    Comment: 10/20/2014 "might have 2 glasses of wine once/nmonth"  . Drug use: No    OB History    Gravida Para Term Preterm AB Living   2       1     SAB TAB Ectopic Multiple Live Births     1            Review of Systems  Constitutional: Negative.   HENT: Negative.   Eyes: Negative.   Respiratory: Negative.   Cardiovascular: Negative.   Gastrointestinal: Positive for abdominal pain, nausea and vomiting.  Endocrine: Negative.   Genitourinary: Negative.   Musculoskeletal: Negative.   Skin: Negative.   Allergic/Immunologic: Negative.   Neurological: Negative.   Hematological: Negative.   Psychiatric/Behavioral: Negative.     Allergies  Patient has no known allergies.  Home Medications    BP 109/68   Pulse 87   Temp 97.7 F (36.5 C)   Resp 18   LMP 09/02/2016 (Exact Date)   Physical Exam  Constitutional: She is oriented to person, place, and time. She appears well-developed  and well-nourished.  HENT:  Head: Normocephalic.  Neck: Normal range of motion.  Cardiovascular: Normal rate, regular rhythm, normal heart sounds and intact distal pulses.  Pulmonary/Chest: Effort normal and breath sounds normal.  Abdominal: There is tenderness.  Genitourinary: Vagina normal and uterus normal.  Musculoskeletal: Normal range of motion.  Neurological: She is alert and oriented to person, place, and time. She has normal reflexes.  Skin: Skin is warm and dry.  Psychiatric: She has a normal mood and affect. Her behavior is normal. Judgment and thought content normal.    MAU Course  Procedures (including critical care time)  Labs Reviewed  CBC WITH DIFFERENTIAL/PLATELET  COMPREHENSIVE METABOLIC PANEL  URINALYSIS, ROUTINE W REFLEX MICROSCOPIC   No results found.   No diagnosis found.    MDM  VSS, FHR pattern stable at present, SVE cl/80/-1 posterior. Uterus hard, tender to touch. Labs ordered, Dr. Jackelyn KnifeMeisinger consulted and coming in to assess pt. 15;10 Dr. Jackelyn KnifeMeisinger in to see pt.

## 2017-04-20 NOTE — MAU Note (Signed)
Patient states Dilaudid took the edge off of her pain, mother and sister at bedside.

## 2017-04-20 NOTE — MAU Note (Signed)
Onset of right side abdominal pain and vomiting about 30 minutes ago.

## 2017-04-20 NOTE — MAU Note (Signed)
Notified Dr. Jackelyn KnifeMeisinger of CT results, per MD will place admit orders for observation and pain control.

## 2017-04-20 NOTE — MAU Note (Signed)
Notified Mindy with radiology that patient is unable to tolerate contrast.

## 2017-04-21 ENCOUNTER — Other Ambulatory Visit: Payer: Self-pay

## 2017-04-21 MED ORDER — HYOSCYAMINE SULFATE 0.125 MG PO TBDP
0.1250 mg | ORAL_TABLET | ORAL | Status: DC | PRN
  Administered 2017-04-21: 0.125 mg via SUBLINGUAL
  Filled 2017-04-21 (×2): qty 1

## 2017-04-21 MED ORDER — TERBUTALINE SULFATE 1 MG/ML IJ SOLN
INTRAMUSCULAR | Status: AC
  Filled 2017-04-21: qty 1

## 2017-04-21 MED ORDER — HYOSCYAMINE SULFATE 0.125 MG PO TBDP
0.1250 mg | ORAL_TABLET | ORAL | Status: DC
  Administered 2017-04-21 – 2017-04-22 (×6): 0.125 mg via SUBLINGUAL
  Filled 2017-04-21 (×11): qty 1

## 2017-04-21 MED ORDER — TERBUTALINE SULFATE 1 MG/ML IJ SOLN
0.2500 mg | Freq: Once | INTRAMUSCULAR | Status: AC
  Administered 2017-04-21: 0.25 mg via SUBCUTANEOUS

## 2017-04-21 MED ORDER — FLEET ENEMA 7-19 GM/118ML RE ENEM
1.0000 | ENEMA | Freq: Every day | RECTAL | Status: DC | PRN
  Administered 2017-04-21: 1 via RECTAL
  Filled 2017-04-21: qty 1

## 2017-04-21 MED ORDER — OXYCODONE HCL 5 MG PO TABS
5.0000 mg | ORAL_TABLET | Freq: Four times a day (QID) | ORAL | Status: DC | PRN
  Administered 2017-04-21 – 2017-04-22 (×4): 5 mg via ORAL
  Filled 2017-04-21 (×4): qty 1

## 2017-04-21 NOTE — Progress Notes (Signed)
Patient stated " I feel like my pain is from having severe constipation since I haven't had a bowel movement since before New Years."  Patient requesting fleets enema.  Dr. Jackelyn KnifeMeisinger notified and orders received.

## 2017-04-21 NOTE — Progress Notes (Signed)
Pt received Dulcolax supp and Miralax, had small hard BM about 30 minutes after suppository.  Pain has been there off and on, got worse around 1200.  No n/v, was able to eat a small amount.  +FM, no ctx Afeb, VSS Abd-still mostly tender RLQ VE-closed, no significant amount of stool palpable in rectum I still suspect bowel etiology for her pain.  Will try hyscyamine for bowel spasm and see how that does.  I discussed the situation with the patient with FOB in the room.

## 2017-04-21 NOTE — H&P (Signed)
Chief Complaint  Patient presents with  . Abdominal Pain  . Emesis    HPI 27yo, G2P0, EGA [redacted]W[redacted]D, with EDC 2-24 presents with sudden onset RLQ pain about an hour ago while standing in kitchen.  Pain comes in waves but is there most of the time.  She had emesis at home and here.  Pain is worse when baby or she moves.  Pain started at the top of the uterus, moved along right side and radiating to her back.  No F/C.  No urinary or bowel issues.  Normal fetal movement, no LOF or VB.  Pregnancy uncomplicated except for Hgb Lepore and recent diagnosis of GDM-not checking sugars yet.          Past Medical History:  Diagnosis Date  . Daily headache   . Depression   . GERD (gastroesophageal reflux disease)   . Sickle cell trait (HCC)   . UTI (lower urinary tract infection) ~ 02/2014   "not recurrent"         Past Surgical History:  Procedure Laterality Date  . NO PAST SURGERIES           Family History  Problem Relation Age of Onset  . Healthy Mother   . Arthritis Maternal Grandmother   . Stroke Maternal Grandfather   . Hypertension Maternal Grandfather     Social History        Tobacco Use  . Smoking status: Never Smoker  . Smokeless tobacco: Never Used  Substance Use Topics  . Alcohol use: Yes    Comment: 10/20/2014 "might have 2 glasses of wine once/nmonth"  . Drug use: No            OB History    Gravida Para Term Preterm AB Living   2       1     SAB TAB Ectopic Multiple Live Births     1            Review of Systems  Constitutional: Negative.   HENT: Negative.   Eyes: Negative.   Respiratory: Negative.   Cardiovascular: Negative.   Gastrointestinal: Positive for abdominal pain, nausea and vomiting.  Endocrine: Negative.   Genitourinary: Negative.   Musculoskeletal: Negative.   Skin: Negative.   Allergic/Immunologic: Negative.   Neurological: Negative.   Hematological: Negative.   Psychiatric/Behavioral:  Negative.     Allergies  Patient has no known allergies.  Home Medications    BP 109/68   Pulse 87   Temp 97.7 F (36.5 C)   Resp 18   LMP 09/02/2016 (Exact Date)   Physical Exam  Constitutional: She is oriented to person, place, and time. She appears well-developed and well-nourished.  Cardiovascular: Normal rate, regular rhythm, normal heart sounds and intact distal pulses.  Pulmonary/Chest: Effort normal and breath sounds normal.    Abdominal: soft, tender RLQ, no rebound, fundus itself   is not tender but tender along right side, no CVAT  VE per CNM closed  A/P:  IUP at [redacted]W[redacted]D today admitted with RLQ pain.  I was suspicious for appendicitis, but CT scan normal except for some small gallstones.  She was admitted for observation since she was having significant pain and no source identified.  Overnight she had several doses of IV dilaudid for pain.  This morning she is feeling better, still some pain but improved, no n/v overnight, hungry this morning.  She now thinks she might be constipated, only has small, infrequent BM.  FHT Cat I with occ ctx.  Will change pain meds to PO, let her eat reg diet, give Miralax and dulcolax suppository and see how she is doing later today.

## 2017-04-21 NOTE — Progress Notes (Signed)
Fleets enema given, pt. Tolerated well.  Patient was able to pass some very hard formed stool, but states she did feel some better.

## 2017-04-22 ENCOUNTER — Observation Stay (HOSPITAL_COMMUNITY): Payer: Medicaid Other

## 2017-04-22 MED ORDER — POLYETHYLENE GLYCOL 3350 17 G PO PACK: 17.0000 g | PACK | Freq: Every day | ORAL | 1 refills | Status: AC

## 2017-04-22 MED ORDER — PRENATAL MULTIVITAMIN CH: 1.0000 | ORAL_TABLET | Freq: Every day | ORAL | 1 refills | Status: AC

## 2017-04-22 MED ORDER — SIMETHICONE 80 MG PO CHEW
80.0000 mg | CHEWABLE_TABLET | Freq: Four times a day (QID) | ORAL | Status: DC | PRN
  Administered 2017-04-22 (×2): 80 mg via ORAL
  Filled 2017-04-22 (×2): qty 1

## 2017-04-22 MED ORDER — OXYCODONE HCL 5 MG PO TABS: 5.0000 mg | ORAL_TABLET | Freq: Four times a day (QID) | ORAL | 0 refills | Status: DC | PRN

## 2017-04-22 NOTE — Discharge Summary (Signed)
OB Discharge Summary     Patient Name: Laura Krause DOB: [birth year] MRN: [id number]  Date of admission: 04/20/2017 Date of discharge: 04/22/2017  Admitting diagnosis: 32 WKS, RIGHT HAND SIDE BURNING, PRESSURE Intrauterine pregnancy: [id number]     Secondary diagnosis:  Active Problems:   Abdominal pain during pregnancy in third trimester  Additional problems: constipation     Discharge diagnosis: Abdominal pain                                                                                                Post partum procedures:{N/A  Augmentation: N/A  Complications: None  Hospital course:  Admitted for pain control.  Negative pelvic CT - no appendicitis, has gallstones.  No kidney stones.  Normal growth US eval of placenta.  Pain controlled with IV dilaudid - trasitioned to po oxyxodone.  Pain improved with BM after enema, bowel regimen with miralax.  No WBC elevation.     Physical exam  Vitals:   04/22/17 0755 04/22/17 1226 04/22/17 1640 04/22/17 1925  BP: (!) 102/59 101/60 (!) 93/52 106/67  Pulse: 79 (!) 102 79 96  Resp: 18 16 16 18   Temp: 98.3 F (36.8 C) 98.3 F (36.8 C) 98.3 F (36.8 C) 99 F (37.2 C)  TempSrc: Oral Oral Oral Oral  SpO2: 100% 100% 100% 100%  Weight:      Height:       General: alert and no distress  Uterine Fundus: gravid NT FHTs appropriate for gestation  Labs: Lab Results  Component Value Date   WBC 10.7 (H) 04/20/2017   HGB 10.6 (L) 04/20/2017   HCT 31.1 (L) 04/20/2017   MCV 74.4 (L) 04/20/2017   PLT 200 04/20/2017   CMP Latest Ref Rng & Units 04/20/2017  Glucose 65 - 99 mg/dL 93  BUN 6 - 20 mg/dL 7  Creatinine 4.690.44 - 6.291.00 mg/dL 5.28(U0.32(L)  Sodium 132135 - 440145 mmol/L 135  Potassium 3.5 - 5.1 mmol/L 3.8  Chloride 101 - 111 mmol/L 106  CO2 22 - 32 mmol/L 18(L)  Calcium 8.9 - 10.3 mg/dL 8.9  Total Protein 6.5 - 8.1 g/dL 6.8  Total Bilirubin 0.3 - 1.2 mg/dL 1.0(U1.4(H)  Alkaline Phos 38 - 126 U/L 136(H)  AST 15 - 41 U/L 27  ALT 14 - 54 U/L 16     Discharge instruction: per After Visit Summary and "Baby and Me Booklet".  After visit meds:  Allergies as of 04/22/2017   No Known Allergies     Medication List    TAKE these medications   acetaminophen 325 MG tablet Commonly known as:  TYLENOL Take 650 mg by mouth every 6 (six) hours as needed for headache.   oxyCODONE 5 MG immediate release tablet Commonly known as:  Oxy IR/ROXICODONE Take 1 tablet (5 mg total) by mouth every 6 (six) hours as needed for severe pain.   pantoprazole 40 MG tablet Commonly known as:  PROTONIX Take 40 mg by mouth daily.   polyethylene glycol packet Commonly known as:  MIRALAX / GLYCOLAX Take 17 g by mouth daily.   prenatal multivitamin Tabs  tablet Take 1 tablet by mouth daily at 12 noon.       Diet: routine diet  Activity: Advance as tolerated.   Outpatient follow up:{as scheduled Follow up Appt:No future appointments. Follow up Visit:No Follow-up on file.    04/22/2017 Sherian Rein, MD

## 2017-04-22 NOTE — Progress Notes (Addendum)
Patient ID: [name], female   DOB: Sep 04, 1990, 27 y.o.   MRN: [id number]   +FM, no LOF, no VB, occ ctx, some relief with BM, still feels like "trapped gas"  Says pressure is better today, but yesterday was like this and "then all hell broke loose" around noon.  D/w pt RLP and other pelvic pressure in later pregnancy.  AFVSS gen NAD FHTs 120's mod var, + accels, category 1 toco occ Abd - soft, gravid, NT, no rebound  D/w MFM - Dr Sherrie Georgeecker.  Reviewed US and CT.  Possibility of placental abruption, growth.  Pain with scanning?  Offered simethicone D/w pt that pain meds can slow bowels Monitor thru day today.   Poss d/c this PM

## 2017-04-22 NOTE — Progress Notes (Signed)
Patient ID: Fredda HammedJelesha Krause, female   DOB: Apr 16, 1991, 27 y.o.   MRN: [id number]   Pain low level starting again.  Just took oxycodone.  Wants to go home tonight, but nervous about it.  Will let us know later.    US today, good growth and dopplers  AFVSS gen NAD Abd soft, gravid, NT

## 2017-04-22 NOTE — Progress Notes (Signed)
Patient ID: Laura HammedJelesha Krause, female   DOB: Aug 18, 1990, 27 y.o.   MRN: [id number]   Pt desires d/c to home.  Pain never materialized.    Given labor and pain precautions D/c with #10 percocet  Has f/u Wednesday.

## 2017-04-22 NOTE — Progress Notes (Signed)
Pt was given discharge instructions. V/s within normal Pt d/c home.

## 2017-04-26 ENCOUNTER — Telehealth: Payer: Self-pay | Admitting: Hematology and Oncology

## 2017-04-26 NOTE — Telephone Encounter (Signed)
Spoke with [facility]ica at [facility] and notified her of appointment D/T/Loc/Ph#.  Also notified patient regarding appointment.

## 2017-05-01 ENCOUNTER — Encounter (HOSPITAL_COMMUNITY): Payer: Self-pay | Admitting: *Deleted

## 2017-05-01 ENCOUNTER — Inpatient Hospital Stay (EMERGENCY_DEPARTMENT_HOSPITAL)
Admission: AD | Admit: 2017-05-01 | Discharge: 2017-05-01 | Discharge status: Against Medical Advice | Payer: Medicaid Other | Source: Ambulatory Visit | Attending: Obstetrics and Gynecology | Admitting: Obstetrics and Gynecology

## 2017-05-01 ENCOUNTER — Inpatient Hospital Stay (HOSPITAL_COMMUNITY): Payer: Medicaid Other | Admitting: Anesthesiology

## 2017-05-01 ENCOUNTER — Inpatient Hospital Stay (HOSPITAL_COMMUNITY)
Admission: EM | Admit: 2017-05-01 | Discharge: 2017-05-04 | DRG: 786 | Disposition: A | Discharge status: Home / Self Care | Payer: Medicaid Other | Attending: Obstetrics and Gynecology | Admitting: Obstetrics and Gynecology

## 2017-05-01 ENCOUNTER — Other Ambulatory Visit: Payer: Self-pay

## 2017-05-01 ENCOUNTER — Encounter (HOSPITAL_COMMUNITY): Payer: Self-pay

## 2017-05-01 ENCOUNTER — Encounter (HOSPITAL_COMMUNITY)
Admission: EM | Disposition: A | Discharge status: Home / Self Care | Payer: Self-pay | Source: Home / Self Care | Attending: Obstetrics and Gynecology

## 2017-05-01 DIAGNOSIS — K567 Ileus, unspecified: Secondary | ICD-10-CM | POA: Diagnosis not present

## 2017-05-01 DIAGNOSIS — O26893 Other specified pregnancy related conditions, third trimester: Secondary | ICD-10-CM | POA: Diagnosis not present

## 2017-05-01 DIAGNOSIS — D649 Anemia, unspecified: Secondary | ICD-10-CM | POA: Diagnosis present

## 2017-05-01 DIAGNOSIS — O24420 Gestational diabetes mellitus in childbirth, diet controlled: Secondary | ICD-10-CM | POA: Diagnosis present

## 2017-05-01 DIAGNOSIS — K3532 Acute appendicitis with perforation and localized peritonitis, without abscess: Secondary | ICD-10-CM | POA: Diagnosis present

## 2017-05-01 DIAGNOSIS — R109 Unspecified abdominal pain: Secondary | ICD-10-CM | POA: Diagnosis not present

## 2017-05-01 DIAGNOSIS — K8020 Calculus of gallbladder without cholecystitis without obstruction: Secondary | ICD-10-CM | POA: Diagnosis present

## 2017-05-01 DIAGNOSIS — Z3A34 34 weeks gestation of pregnancy: Secondary | ICD-10-CM

## 2017-05-01 DIAGNOSIS — Z9049 Acquired absence of other specified parts of digestive tract: Secondary | ICD-10-CM

## 2017-05-01 DIAGNOSIS — O9902 Anemia complicating childbirth: Secondary | ICD-10-CM | POA: Diagnosis present

## 2017-05-01 DIAGNOSIS — Z98891 History of uterine scar from previous surgery: Secondary | ICD-10-CM

## 2017-05-01 DIAGNOSIS — D573 Sickle-cell trait: Secondary | ICD-10-CM | POA: Diagnosis present

## 2017-05-01 DIAGNOSIS — O4703 False labor before 37 completed weeks of gestation, third trimester: Secondary | ICD-10-CM

## 2017-05-01 DIAGNOSIS — O9962 Diseases of the digestive system complicating childbirth: Principal | ICD-10-CM | POA: Diagnosis present

## 2017-05-01 HISTORY — DX: History of uterine scar from previous surgery: Z98.891

## 2017-05-01 HISTORY — DX: Calculus of gallbladder without cholecystitis without obstruction: K80.20

## 2017-05-01 HISTORY — DX: Acquired absence of other specified parts of digestive tract: Z90.49

## 2017-05-01 HISTORY — PX: APPENDECTOMY: SHX54

## 2017-05-01 LAB — CBC
HCT: 31.1 % — ABNORMAL LOW (ref 36.0–46.0)
HEMATOCRIT: 29.3 % — AB (ref 36.0–46.0)
HEMOGLOBIN: 10.1 g/dL — AB (ref 12.0–15.0)
Hemoglobin: 10.6 g/dL — ABNORMAL LOW (ref 12.0–15.0)
MCH: 25.1 pg — ABNORMAL LOW (ref 26.0–34.0)
MCH: 25.4 pg — ABNORMAL LOW (ref 26.0–34.0)
MCHC: 34.1 g/dL (ref 30.0–36.0)
MCHC: 34.5 g/dL (ref 30.0–36.0)
MCV: 73.5 fL — ABNORMAL LOW (ref 78.0–100.0)
MCV: 73.8 fL — AB (ref 78.0–100.0)
Platelets: 274 10*3/uL (ref 150–400)
Platelets: 264 10*3/uL (ref 150–400)
RBC: 4.23 MIL/uL (ref 3.87–5.11)
RBC: 3.97 MIL/uL (ref 3.87–5.11)
RDW: 21.8 % — ABNORMAL HIGH (ref 11.5–15.5)
RDW: 21.8 % — ABNORMAL HIGH (ref 11.5–15.5)
WBC: 18.3 10*3/uL — ABNORMAL HIGH (ref 4.0–10.5)
WBC: 21.7 10*3/uL — AB (ref 4.0–10.5)

## 2017-05-01 LAB — COMPREHENSIVE METABOLIC PANEL
ALT: 15 U/L (ref 14–54)
AST: 21 U/L (ref 15–41)
Albumin: 3.6 g/dL (ref 3.5–5.0)
Alkaline Phosphatase: 160 U/L — ABNORMAL HIGH (ref 38–126)
Anion gap: 10 (ref 5–15)
BUN: 6 mg/dL (ref 6–20)
CO2: 19 mmol/L — ABNORMAL LOW (ref 22–32)
Calcium: 8.8 mg/dL — ABNORMAL LOW (ref 8.9–10.3)
Chloride: 102 mmol/L (ref 101–111)
Creatinine, Ser: 0.38 mg/dL — ABNORMAL LOW (ref 0.44–1.00)
GFR calc Af Amer: >60 mL/min (ref >60)
GFR calc non Af Amer: >60 mL/min (ref >60)
Glucose, Bld: 85 mg/dL (ref 65–99)
Potassium: 3.9 mmol/L (ref 3.5–5.1)
Sodium: 131 mmol/L — ABNORMAL LOW (ref 135–145)
Total Bilirubin: 1.4 mg/dL — ABNORMAL HIGH (ref 0.3–1.2)
Total Protein: 7.2 g/dL (ref 6.5–8.1)

## 2017-05-01 LAB — GLUCOSE, CAPILLARY: Glucose-Capillary: 118 mg/dL — ABNORMAL HIGH (ref 65–99)

## 2017-05-01 LAB — DIFFERENTIAL
Basophils Absolute: 0 10*3/uL (ref 0.0–0.1)
Basophils Relative: 0 %
Eosinophils Absolute: 0.1 10*3/uL (ref 0.0–0.7)
Eosinophils Relative: 0 %
Lymphocytes Relative: 11 %
Lymphs Abs: 2.2 10*3/uL (ref 0.7–4.0)
Monocytes Absolute: 1.8 10*3/uL (ref 0.1–1.0)
Monocytes Relative: 10 %
Neutro Abs: 14.8 10*3/uL (ref 1.7–7.7)
Neutrophils Relative %: 79 %

## 2017-05-01 LAB — I-STAT TROPONIN, ED: Troponin i, poc: 0.02 ng/mL (ref 0.00–0.08)

## 2017-05-01 LAB — URINALYSIS, ROUTINE W REFLEX MICROSCOPIC
Bilirubin Urine: NEGATIVE
Glucose, UA: NEGATIVE mg/dL
Hgb urine dipstick: NEGATIVE
Ketones, ur: 5 mg/dL — AB
Leukocytes, UA: NEGATIVE
Nitrite: NEGATIVE
Protein, ur: NEGATIVE mg/dL
Specific Gravity, Urine: 1.012 (ref 1.005–1.030)
pH: 7 (ref 5.0–8.0)

## 2017-05-01 LAB — AMYLASE: Amylase: 50 U/L (ref 28–100)

## 2017-05-01 LAB — FETAL FIBRONECTIN: Fetal Fibronectin: NEGATIVE

## 2017-05-01 LAB — GROUP B STREP BY PCR: Group B strep by PCR: NEGATIVE

## 2017-05-01 LAB — LIPASE, BLOOD: Lipase: 21 U/L (ref 11–51)

## 2017-05-01 SURGERY — Surgical Case
Anesthesia: Epidural

## 2017-05-01 MED ORDER — MAGNESIUM SULFATE 40 G IN LACTATED RINGERS - SIMPLE
2.0000 g/h | INTRAVENOUS | Status: DC
  Filled 2017-05-01: qty 500

## 2017-05-01 MED ORDER — ACETAMINOPHEN 325 MG PO TABS: 650.0000 mg | ORAL_TABLET | ORAL | Status: DC | PRN

## 2017-05-01 MED ORDER — MEPERIDINE HCL 25 MG/ML IJ SOLN: 6.2500 mg | INTRAMUSCULAR | Status: DC | PRN

## 2017-05-01 MED ORDER — SCOPOLAMINE 1 MG/3DAYS TD PT72
MEDICATED_PATCH | TRANSDERMAL | Status: AC
  Filled 2017-05-01: qty 1

## 2017-05-01 MED ORDER — FENTANYL CITRATE (PF) 250 MCG/5ML IJ SOLN
INTRAMUSCULAR | Status: AC
  Filled 2017-05-01: qty 5

## 2017-05-01 MED ORDER — BUTORPHANOL TARTRATE 1 MG/ML IJ SOLN
1.0000 mg | Freq: Once | INTRAMUSCULAR | Status: AC
  Administered 2017-05-01: 1 mg via INTRAVENOUS
  Filled 2017-05-01: qty 1

## 2017-05-01 MED ORDER — LACTATED RINGERS IV SOLN: 500.0000 mL | INTRAVENOUS | Status: DC | PRN

## 2017-05-01 MED ORDER — LIDOCAINE-EPINEPHRINE (PF) 2 %-1:200000 IJ SOLN
INTRAMUSCULAR | Status: AC
  Filled 2017-05-01: qty 20

## 2017-05-01 MED ORDER — SODIUM BICARBONATE 8.4 % IV SOLN
INTRAVENOUS | Status: DC | PRN
  Administered 2017-05-01: 3 mL via EPIDURAL
  Administered 2017-05-01: 2 mL via EPIDURAL
  Administered 2017-05-01: 5 mL via EPIDURAL
  Administered 2017-05-01: 10 mL via EPIDURAL

## 2017-05-01 MED ORDER — ONDANSETRON HCL 4 MG/2ML IJ SOLN
INTRAMUSCULAR | Status: AC
  Filled 2017-05-01: qty 4

## 2017-05-01 MED ORDER — TERBUTALINE SULFATE 1 MG/ML IJ SOLN
INTRAMUSCULAR | Status: AC
  Filled 2017-05-01: qty 1

## 2017-05-01 MED ORDER — OXYTOCIN BOLUS FROM INFUSION: 500.0000 mL | Freq: Once | INTRAVENOUS | Status: DC

## 2017-05-01 MED ORDER — LACTATED RINGERS IV SOLN
500.0000 mL | Freq: Once | INTRAVENOUS | Status: AC
  Administered 2017-05-01: 1000 mL via INTRAVENOUS

## 2017-05-01 MED ORDER — SUCCINYLCHOLINE CHLORIDE 20 MG/ML IJ SOLN: INTRAMUSCULAR | Status: DC | PRN

## 2017-05-01 MED ORDER — PROMETHAZINE HCL 25 MG/ML IJ SOLN: 12.5000 mg | Freq: Once | INTRAMUSCULAR | Status: DC

## 2017-05-01 MED ORDER — MORPHINE SULFATE (PF) 0.5 MG/ML IJ SOLN
INTRAMUSCULAR | Status: DC | PRN
  Administered 2017-05-01: 4 mg via EPIDURAL
  Administered 2017-05-01: 1 mg via INTRAVENOUS

## 2017-05-01 MED ORDER — FENTANYL CITRATE (PF) 100 MCG/2ML IJ SOLN
INTRAMUSCULAR | Status: DC | PRN
  Administered 2017-05-01: 50 ug via INTRAVENOUS
  Administered 2017-05-01: 100 ug via INTRAVENOUS
  Administered 2017-05-01: 50 ug via INTRAVENOUS
  Administered 2017-05-01: 100 ug via EPIDURAL
  Administered 2017-05-01: 50 ug via INTRAVENOUS

## 2017-05-01 MED ORDER — FENTANYL CITRATE (PF) 100 MCG/2ML IJ SOLN
50.0000 ug | Freq: Once | INTRAMUSCULAR | Status: AC
  Administered 2017-05-01: 50 ug via INTRAVENOUS
  Filled 2017-05-01: qty 2

## 2017-05-01 MED ORDER — SUCCINYLCHOLINE CHLORIDE 200 MG/10ML IV SOSY
PREFILLED_SYRINGE | INTRAVENOUS | Status: AC
  Filled 2017-05-01: qty 10

## 2017-05-01 MED ORDER — DEXAMETHASONE SODIUM PHOSPHATE 4 MG/ML IJ SOLN
INTRAMUSCULAR | Status: AC
Start: 2017-05-01 — End: ?
  Filled 2017-05-01: qty 1

## 2017-05-01 MED ORDER — FENTANYL 2.5 MCG/ML BUPIVACAINE 1/10 % EPIDURAL INFUSION (WH - ANES)
14.0000 mL/h | INTRAMUSCULAR | Status: DC | PRN
  Administered 2017-05-01: 14 mL/h via EPIDURAL
  Filled 2017-05-01: qty 100

## 2017-05-01 MED ORDER — OXYCODONE-ACETAMINOPHEN 5-325 MG PO TABS: 1.0000 | ORAL_TABLET | ORAL | Status: DC | PRN

## 2017-05-01 MED ORDER — NIFEDIPINE 10 MG PO CAPS
10.0000 mg | ORAL_CAPSULE | ORAL | Status: AC | PRN
  Administered 2017-05-01 (×4): 10 mg via ORAL
  Filled 2017-05-01 (×4): qty 1

## 2017-05-01 MED ORDER — PIPERACILLIN-TAZOBACTAM 3.375 G IVPB 30 MIN
3.3750 g | Freq: Once | INTRAVENOUS | Status: DC
  Filled 2017-05-01: qty 50

## 2017-05-01 MED ORDER — LACTATED RINGERS IV SOLN
INTRAVENOUS | Status: DC
  Administered 2017-05-01 (×2): via INTRAVENOUS

## 2017-05-01 MED ORDER — FENTANYL CITRATE (PF) 100 MCG/2ML IJ SOLN
INTRAMUSCULAR | Status: AC
  Filled 2017-05-01: qty 2

## 2017-05-01 MED ORDER — KETAMINE HCL 10 MG/ML IJ SOLN
INTRAMUSCULAR | Status: AC
  Filled 2017-05-01: qty 1

## 2017-05-01 MED ORDER — PENICILLIN G POT IN DEXTROSE 60000 UNIT/ML IV SOLN
3.0000 10*6.[IU] | INTRAVENOUS | Status: DC
  Administered 2017-05-01: 3 10*6.[IU] via INTRAVENOUS
  Filled 2017-05-01 (×2): qty 50

## 2017-05-01 MED ORDER — PIPERACILLIN-TAZOBACTAM 3.375 G IVPB 30 MIN
3.3750 g | Freq: Once | INTRAVENOUS | Status: AC
  Administered 2017-05-01: 3.375 g via INTRAVENOUS
  Filled 2017-05-01: qty 50

## 2017-05-01 MED ORDER — LACTATED RINGERS IV BOLUS (SEPSIS)
1000.0000 mL | Freq: Once | INTRAVENOUS | Status: AC
  Administered 2017-05-01: 1000 mL via INTRAVENOUS

## 2017-05-01 MED ORDER — FENTANYL CITRATE (PF) 100 MCG/2ML IJ SOLN
100.0000 ug | Freq: Once | INTRAMUSCULAR | Status: AC
  Administered 2017-05-01: 100 ug via INTRAVENOUS
  Filled 2017-05-01: qty 2

## 2017-05-01 MED ORDER — ACETAMINOPHEN 10 MG/ML IV SOLN
INTRAVENOUS | Status: AC
Start: 2017-05-01 — End: 2017-05-01
  Filled 2017-05-01: qty 100

## 2017-05-01 MED ORDER — LIDOCAINE HCL (PF) 1 % IJ SOLN
INTRAMUSCULAR | Status: DC | PRN
  Administered 2017-05-01 (×2): 4 mL via EPIDURAL

## 2017-05-01 MED ORDER — ONDANSETRON HCL 4 MG/2ML IJ SOLN
INTRAMUSCULAR | Status: DC | PRN
  Administered 2017-05-01: 4 mg via INTRAVENOUS

## 2017-05-01 MED ORDER — OXYTOCIN 40 UNITS IN LACTATED RINGERS INFUSION - SIMPLE MED: 2.5000 [IU]/h | INTRAVENOUS | Status: DC

## 2017-05-01 MED ORDER — LACTATED RINGERS IV SOLN
INTRAVENOUS | Status: DC | PRN
  Administered 2017-05-01 (×2): via INTRAVENOUS

## 2017-05-01 MED ORDER — DIPHENHYDRAMINE HCL 50 MG/ML IJ SOLN: 12.5000 mg | INTRAMUSCULAR | Status: DC | PRN

## 2017-05-01 MED ORDER — MAGNESIUM SULFATE BOLUS VIA INFUSION
4.0000 g | Freq: Once | INTRAVENOUS | Status: AC
  Administered 2017-05-01: 4 g via INTRAVENOUS
  Filled 2017-05-01: qty 500

## 2017-05-01 MED ORDER — MIDAZOLAM HCL 5 MG/5ML IJ SOLN
INTRAMUSCULAR | Status: DC | PRN
  Administered 2017-05-01 (×2): 1 mg via INTRAVENOUS

## 2017-05-01 MED ORDER — LACTATED RINGERS IV SOLN
INTRAVENOUS | Status: DC
  Administered 2017-05-01: 10:00:00 via INTRAVENOUS

## 2017-05-01 MED ORDER — SOD CITRATE-CITRIC ACID 500-334 MG/5ML PO SOLN
30.0000 mL | ORAL | Status: DC | PRN
  Administered 2017-05-01: 30 mL via ORAL
  Filled 2017-05-01: qty 15

## 2017-05-01 MED ORDER — LIDOCAINE HCL (CARDIAC) 20 MG/ML IV SOLN
INTRAVENOUS | Status: AC
  Filled 2017-05-01: qty 5

## 2017-05-01 MED ORDER — PHENYLEPHRINE 40 MCG/ML (10ML) SYRINGE FOR IV PUSH (FOR BLOOD PRESSURE SUPPORT)
80.0000 ug | PREFILLED_SYRINGE | INTRAVENOUS | Status: DC | PRN
  Filled 2017-05-01: qty 10

## 2017-05-01 MED ORDER — CEFAZOLIN SODIUM-DEXTROSE 2-3 GM-%(50ML) IV SOLR
INTRAVENOUS | Status: DC | PRN
  Administered 2017-05-01: 2 g via INTRAVENOUS

## 2017-05-01 MED ORDER — ONDANSETRON HCL 4 MG/2ML IJ SOLN: 4.0000 mg | Freq: Four times a day (QID) | INTRAMUSCULAR | Status: DC | PRN

## 2017-05-01 MED ORDER — OXYTOCIN 10 UNIT/ML IJ SOLN
INTRAMUSCULAR | Status: AC
  Filled 2017-05-01: qty 4

## 2017-05-01 MED ORDER — ACETAMINOPHEN 10 MG/ML IV SOLN
INTRAVENOUS | Status: DC | PRN
  Administered 2017-05-01: 1000 mg via INTRAVENOUS

## 2017-05-01 MED ORDER — OXYCODONE-ACETAMINOPHEN 5-325 MG PO TABS: 2.0000 | ORAL_TABLET | ORAL | Status: DC | PRN

## 2017-05-01 MED ORDER — LIDOCAINE HCL (PF) 1 % IJ SOLN: 30.0000 mL | INTRAMUSCULAR | Status: DC | PRN

## 2017-05-01 MED ORDER — MIDAZOLAM HCL 2 MG/2ML IJ SOLN
INTRAMUSCULAR | Status: AC
  Filled 2017-05-01: qty 2

## 2017-05-01 MED ORDER — DEXAMETHASONE SODIUM PHOSPHATE 4 MG/ML IJ SOLN
INTRAMUSCULAR | Status: DC | PRN
  Administered 2017-05-01: 4 mg via INTRAVENOUS

## 2017-05-01 MED ORDER — PENICILLIN G POTASSIUM 5000000 UNITS IJ SOLR
5.0000 10*6.[IU] | Freq: Once | INTRAVENOUS | Status: AC
  Administered 2017-05-01: 5 10*6.[IU] via INTRAVENOUS
  Filled 2017-05-01: qty 5

## 2017-05-01 MED ORDER — BUTORPHANOL TARTRATE 1 MG/ML IJ SOLN
1.0000 mg | INTRAMUSCULAR | Status: DC | PRN
  Administered 2017-05-01 (×2): 1 mg via INTRAVENOUS
  Filled 2017-05-01 (×2): qty 1

## 2017-05-01 MED ORDER — OXYTOCIN 10 UNIT/ML IJ SOLN
INTRAVENOUS | Status: DC | PRN
  Administered 2017-05-01: 40 [IU] via INTRAVENOUS

## 2017-05-01 MED ORDER — PROPOFOL 10 MG/ML IV BOLUS
INTRAVENOUS | Status: AC
  Filled 2017-05-01: qty 20

## 2017-05-01 MED ORDER — SODIUM BICARBONATE 8.4 % IV SOLN
INTRAVENOUS | Status: AC
  Filled 2017-05-01: qty 50

## 2017-05-01 MED ORDER — PROPOFOL 10 MG/ML IV BOLUS
INTRAVENOUS | Status: DC | PRN
  Administered 2017-05-01: 150 mg via INTRAVENOUS

## 2017-05-01 MED ORDER — TERBUTALINE SULFATE 1 MG/ML IJ SOLN
0.2500 mg | Freq: Once | INTRAMUSCULAR | Status: AC
  Administered 2017-05-01: 0.25 mg via SUBCUTANEOUS

## 2017-05-01 MED ORDER — PHENYLEPHRINE 40 MCG/ML (10ML) SYRINGE FOR IV PUSH (FOR BLOOD PRESSURE SUPPORT): 80.0000 ug | PREFILLED_SYRINGE | INTRAVENOUS | Status: DC | PRN

## 2017-05-01 MED ORDER — LIDOCAINE HCL (CARDIAC) 20 MG/ML IV SOLN
INTRAVENOUS | Status: DC | PRN
  Administered 2017-05-01: 80 mg via INTRATRACHEAL

## 2017-05-01 MED ORDER — SUCCINYLCHOLINE CHLORIDE 20 MG/ML IJ SOLN
INTRAMUSCULAR | Status: DC | PRN
  Administered 2017-05-01: 120 mg via INTRAVENOUS

## 2017-05-01 MED ORDER — MORPHINE SULFATE (PF) 0.5 MG/ML IJ SOLN
INTRAMUSCULAR | Status: AC
  Filled 2017-05-01: qty 10

## 2017-05-01 MED ORDER — EPHEDRINE 5 MG/ML INJ: 10.0000 mg | INTRAVENOUS | Status: DC | PRN

## 2017-05-01 MED ORDER — MEPERIDINE HCL 25 MG/ML IJ SOLN
INTRAMUSCULAR | Status: DC | PRN
  Administered 2017-05-01: 12.5 mg via INTRAVENOUS

## 2017-05-01 MED ORDER — MEPERIDINE HCL 25 MG/ML IJ SOLN
INTRAMUSCULAR | Status: AC
  Filled 2017-05-01: qty 1

## 2017-05-01 SURGICAL SUPPLY — 39 items
BENZOIN TINCTURE PRP APPL 2/3 (GAUZE/BANDAGES/DRESSINGS) ×3 IMPLANT
CHLORAPREP W/TINT 26ML (MISCELLANEOUS) ×3 IMPLANT
CLAMP CORD UMBIL (MISCELLANEOUS) IMPLANT
CLOSURE STERI STRIP 1/2 X4 (GAUZE/BANDAGES/DRESSINGS) ×3 IMPLANT
CLOTH BEACON ORANGE TIMEOUT ST (SAFETY) ×3 IMPLANT
DRSG OPSITE POSTOP 4X10 (GAUZE/BANDAGES/DRESSINGS) ×3 IMPLANT
ELECT REM PT RETURN 9FT ADLT (ELECTROSURGICAL) ×3
ELECTRODE REM PT RTRN 9FT ADLT (ELECTROSURGICAL) ×1 IMPLANT
EXTRACTOR VACUUM M CUP 4 TUBE (SUCTIONS) IMPLANT
EXTRACTOR VACUUM M CUP 4' TUBE (SUCTIONS)
GLOVE BIO SURGEON STRL SZ 6.5 (GLOVE) ×2 IMPLANT
GLOVE BIO SURGEONS STRL SZ 6.5 (GLOVE) ×1
GLOVE BIOGEL PI IND STRL 7.0 (GLOVE) ×1 IMPLANT
GLOVE BIOGEL PI INDICATOR 7.0 (GLOVE) ×2
GOWN STRL REUS W/TWL LRG LVL3 (GOWN DISPOSABLE) ×6 IMPLANT
KIT ABG SYR 3ML LUER SLIP (SYRINGE) IMPLANT
NEEDLE HYPO 25X5/8 SAFETYGLIDE (NEEDLE) IMPLANT
NS IRRIG 1000ML POUR BTL (IV SOLUTION) ×3 IMPLANT
PACK C SECTION WH (CUSTOM PROCEDURE TRAY) ×3 IMPLANT
PAD OB MATERNITY 4.3X12.25 (PERSONAL CARE ITEMS) ×3 IMPLANT
PENCIL SMOKE EVAC W/HOLSTER (ELECTROSURGICAL) ×3 IMPLANT
RTRCTR C-SECT PINK 25CM LRG (MISCELLANEOUS) ×3 IMPLANT
STAPLER PROXIMATE 75MM BLUE (STAPLE) ×3 IMPLANT
STRIP CLOSURE SKIN 1/2X4 (GAUZE/BANDAGES/DRESSINGS) ×2 IMPLANT
SUT MNCRL 0 VIOLET CTX 36 (SUTURE) ×2 IMPLANT
SUT MONOCRYL 0 CTX 36 (SUTURE) ×4
SUT PLAIN 1 NONE 54 (SUTURE) IMPLANT
SUT PLAIN 2 0 (SUTURE) ×2
SUT PLAIN 2 0 XLH (SUTURE) ×3 IMPLANT
SUT PLAIN ABS 2-0 CT1 27XMFL (SUTURE) ×1 IMPLANT
SUT SILK 2 0 SH (SUTURE) ×3 IMPLANT
SUT VIC AB 0 CT1 27 (SUTURE) ×4
SUT VIC AB 0 CT1 27XBRD ANBCTR (SUTURE) ×2 IMPLANT
SUT VIC AB 2-0 CT1 27 (SUTURE) ×2
SUT VIC AB 2-0 CT1 TAPERPNT 27 (SUTURE) ×1 IMPLANT
SUT VIC AB 4-0 KS 27 (SUTURE) ×3 IMPLANT
SYR BULB IRRIGATION 50ML (SYRINGE) ×3 IMPLANT
TOWEL OR 17X24 6PK STRL BLUE (TOWEL DISPOSABLE) ×3 IMPLANT
TRAY FOLEY BAG SILVER LF 14FR (SET/KITS/TRAYS/PACK) ×3 IMPLANT

## 2017-05-01 NOTE — H&P (Addendum)
Laura Krause is a 27 y.o. female g2p0010 at 34+3 with abdominal pain/ctx and cervical change.  [facility], unhappy w care left AMA and [facility], there cervical change was noted - [facility].  Admitted to L&D - Magnesium started.  Pain despite stadol and laboring feeling cervix.  Received epidural.  Has received BMZ 1/5 and 1/6 when hopsitalized with abdominal pain.  Neg appendicitis w/u.Marland Kitchen.  Has Hgb Lepore OB History    Gravida Para Term Preterm AB Living   2       1     SAB TAB Ectopic Multiple Live Births     1          G1 TAB G2 present, GDM not checking sugars, never went to NDM  No abn pap + h/o Chl  Past Medical History:  Diagnosis Date  . Daily headache   . Depression   . Gallstones   . GERD (gastroesophageal reflux disease)   . Sickle cell trait (HCC)   . UTI (lower urinary tract infection) ~ 02/2014   "not recurrent"   Past Surgical History:  Procedure Laterality Date  . NO PAST SURGERIES     Family History: family history includes Arthritis in her maternal grandmother; Healthy in her mother; Hypertension in her maternal grandfather; Stroke in her maternal grandfather. Social History:  reports that  has never smoked. she has never used smokeless tobacco. She reports that she drinks alcohol. She reports that she does not use drugs. single  Meds Protonix, PNV All NKDA     Maternal Diabetes: Yes:  Diabetes Type:  Diet controlled Genetic Screening: Normal Maternal Ultrasounds/Referrals: Normal Fetal Ultrasounds or other Referrals:  None Maternal Substance Abuse:  No Significant Maternal Medications:  None Significant Maternal Lab Results:  Lab values include: Other: GBBS neg Other Comments:  None  Review of Systems  Constitutional: Negative.   Eyes: Negative.   Respiratory: Negative.   Cardiovascular: Negative.   Gastrointestinal: Positive for abdominal pain and constipation.  Genitourinary: Negative.    Musculoskeletal: Positive for back pain.  Neurological: Negative.   Psychiatric/Behavioral: Negative.    Maternal Medical History:  Reason for admission: Contractions.   Contractions: Frequency: regular.   Perceived severity is strong.    Fetal activity: Perceived fetal activity is normal.    Prenatal complications: Admitted for abdominal pain - 04/20/17.  Neg appendix w/u  Prenatal Complications - Diabetes: gestational.    Dilation: 1.5 Effacement (%): 90 Station: -1 Exam by:: Whitlock Blood pressure 104/64, pulse (!) 106, temperature 99.1 F (37.3 C), temperature source Oral, resp. rate 20, height 5\' 6"  (1.676 m), weight 59 kg (130 lb), last menstrual period 09/02/2016, SpO2 100 %. Maternal Exam:  Uterine Assessment: Contraction strength is moderate.  Contraction frequency is regular.   Abdomen: Fundal height is appropriate for gestation.   Estimated fetal weight is 6#.   Fetal presentation: vertex  Introitus: Normal vulva. Normal vagina.  Cervix: Cervix evaluated by digital exam.     Physical Exam  Constitutional: She is oriented to person, place, and time. She appears well-developed and well-nourished.  HENT:  Head: Normocephalic and atraumatic.  Cardiovascular: Normal rate and regular rhythm.  Respiratory: Effort normal and breath sounds normal. No respiratory distress. She has no wheezes.  GI: Soft. Bowel sounds are normal. She exhibits no distension. There is tenderness.  Musculoskeletal: Normal range of motion.  Neurological: She is alert and oriented to person, place, and time.  Skin: Skin is  warm and dry.  Psychiatric: She has a normal mood and affect. Her behavior is normal.    Prenatal labs: ABO, Rh: --/--/O POS (01/16 0845) Antibody: NEG (01/16 0845) (early in care positive) Rubella:  immune RPR: Non Reactive (06/28 2120)  HBsAg:   neg HIV: Non Reactive (06/28 2120)  GBS:   unknown  Hgb 10.9/Plt 308K/Ur Cx neg/ GC neg/Chl neg/ Varicella immune/ Hgb  lepore/Pap WNL/First tri scr neg/ early [place] cwd/glucola 136 - 3hr GDM/essential panel neg (neg CF, SMA, fragile X), nl anat, post plac, female  Decline Tdap  Assessment/Plan: 26yo G2P0010 admitted with cervical change and abd pain/ctx Mg sulfate for CP prophylaxis S/p BMZ 1/5 and 1/6 Epidural for comfort NICU aware  Check FSBS q 1-2 hr If >120, start glucomander GBBS neg per PCR  Dixie Jafri Bovard-Stuckert 05/01/2017, 5:44 PM

## 2017-05-01 NOTE — Progress Notes (Signed)
Patient ID: Laura Krause, female   DOB: 06-10-1990, 27 y.o.   MRN: [id number]   Magnesium off Pt feeling pain despite epidural and doses More constant pain  No relief w terb  Pt agrees to csection, questions answered, will proceed when OR able.   FHTs 140's, min/mod var, category 2 toco q 2-764min  D/w pt and family situation

## 2017-05-01 NOTE — Progress Notes (Signed)
CNM informed of pt's pain, crying, says she has had no relief.

## 2017-05-01 NOTE — ED Notes (Signed)
OB rapid response called. 

## 2017-05-01 NOTE — Anesthesia Preprocedure Evaluation (Addendum)
Anesthesia Evaluation  Patient identified by MRN, date of birth, ID band Patient awake    Reviewed: Allergy & Precautions, NPO status , Patient's Chart, lab work & pertinent test results  Airway Mallampati: II  TM Distance: >3 FB Neck ROM: Full    Dental no notable dental hx. (+) Dental Advisory Given   Pulmonary neg pulmonary ROS,    Pulmonary exam normal        Cardiovascular negative cardio ROS Normal cardiovascular exam     Neuro/Psych negative neurological ROS  negative psych ROS   GI/Hepatic negative GI ROS, Neg liver ROS, GERD  ,  Endo/Other  negative endocrine ROS  Renal/GU negative Renal ROS  negative genitourinary   Musculoskeletal negative musculoskeletal ROS (+)   Abdominal   Peds negative pediatric ROS (+)  Hematology negative hematology ROS (+)   Anesthesia Other Findings   Reproductive/Obstetrics (+) Pregnancy                             Anesthesia Physical Anesthesia Plan  ASA: II  Anesthesia Plan: Epidural   Post-op Pain Management:    Induction:   PONV Risk Score and Plan:   Airway Management Planned: Natural Airway  Additional Equipment:   Intra-op Plan:   Post-operative Plan:   Informed Consent: I have reviewed the patients History and Physical, chart, labs and discussed the procedure including the risks, benefits and alternatives for the proposed anesthesia with the patient or authorized representative who has indicated his/her understanding and acceptance.   Dental advisory given  Plan Discussed with: Anesthesiologist  Anesthesia Plan Comments:         Anesthesia Quick Evaluation

## 2017-05-01 NOTE — MAU Note (Signed)
Pt's mother states she has decided to take her daughter somewhere else.  Pt agrees, IV & EFM removed, pt signed out AMA.

## 2017-05-01 NOTE — Progress Notes (Signed)
Patient ID: Laura Krause, female   DOB: 1990-10-20, 27 y.o.   MRN: [id number]   With epidural can't feel ctx, but constant abdominal pain - feels tight/pressure  PE  gen uncomfortable, unwell Abd gravid, nonspecific tender, +BS no rebound, but tender.    Nl CMP, nl amylase, lipase, CBC WBC 21, no left shift  Neg CT 1/6 Has gallstones Growth scan 24% 04/21/17  SVE 1.5/100/0  D/w [name] - will give Phenergan to relax sphincter of Oddi to evaluate that .   FHTs 140 min var (but stadol and Mg) category 2 Ctx q 2min  Concerned re abruption and peritonitis.   D/c magnesium - eval off, poss delivery  D/W pt findings.  Flat baby, no resting tome, poss peritonitis, poss concealed abruption.   Likely LTCS - d/w pt r/b/a

## 2017-05-01 NOTE — Anesthesia Procedure Notes (Signed)
Epidural Patient location during procedure: OB Start time: 05/01/2017 4:18 PM End time: 05/01/2017 4:31 PM  Staffing Anesthesiologist: Heather RobertsSinger, Laurier Jasperson, MD Performed: anesthesiologist   Preanesthetic Checklist Completed: patient identified, site marked, pre-op evaluation, timeout performed, IV checked, risks and benefits discussed and monitors and equipment checked  Epidural Patient position: sitting Prep: DuraPrep Patient monitoring: heart rate, cardiac monitor, continuous pulse ox and blood pressure Approach: midline Location: L2-L3 Injection technique: LOR saline  Needle:  Needle type: Tuohy  Needle gauge: 17 G Needle length: 9 cm Needle insertion depth: 5 cm Catheter size: 20 Guage Catheter at skin depth: 10 cm Test dose: negative and Other  Assessment Events: blood not aspirated, injection not painful, no injection resistance and negative IV test  Additional Notes Informed consent obtained prior to proceeding including risk of failure, 1% risk of PDPH, risk of minor discomfort and bruising.  Discussed rare but serious complications including epidural abscess, permanent nerve injury, epidural hematoma.  Discussed alternatives to epidural analgesia and patient desires to proceed.  Timeout performed pre-procedure verifying patient name, procedure, and platelet count.  Patient tolerated procedure well.

## 2017-05-01 NOTE — ED Notes (Signed)
Rapid OB at bedside 

## 2017-05-01 NOTE — MAU Note (Signed)
Pt & her mother very upset that pt is still in pain, worried about infection.  Informed pt & her family that Dr. Ellyn HackBovard is coming to see her after 1200.  Pt asked if she can leave, informed her that she can sign out AMA if that is her decision.  Pt's mother states she has already called [facility] about the situation.  Encouraged pt to wait & discuss concerns with Dr. Ellyn HackBovard.

## 2017-05-01 NOTE — ED Triage Notes (Signed)
Pt presents to the ed with complaints of right lower quadrant pain x 3 weeks with lower abdominal pain. [facility] but did not agree with how she was being treated and came here. Family is concerned about appendicitis. This rn offered emotional support and consulting with Dr. Freida BusmanAllen EDP

## 2017-05-01 NOTE — Progress Notes (Signed)
Dr Ellyn HackBovard called [facility] to say she was aware that pt was at [facility].  RN informed that pt had been at [facility]U today but wasn't happy with care, and left.  MD also states that pt was worked up for abdominal pain approx 2 wks [facility] with no [facility].  Dr Ellyn HackBovard made aware that pt at this time is complaining of constant abdominal pain that she states has been occurring for two weeks but worsened today.  MD gives order to check pt.  Cervix  Is 1.5/70/-2 and pt is having regular ucs/ui.  MD gives order for pt to be transferred back to [facility].

## 2017-05-01 NOTE — Anesthesia Procedure Notes (Signed)
Procedure Name: Intubation Date/Time: 05/01/2017 10:38 PM Performed by: Beryle LatheBrock, Kalena Mander E, MD Pre-anesthesia Checklist: Patient identified, Emergency Drugs available, Suction available and Patient being monitored Patient Re-evaluated:Patient Re-evaluated prior to induction Oxygen Delivery Method: Circle system utilized Preoxygenation: Pre-oxygenation with 100% oxygen Induction Type: IV induction, Rapid sequence and Cricoid Pressure applied Laryngoscope Size: Glidescope and 3 Grade View: Grade I Tube type: Oral Tube size: 7.0 mm Number of attempts: 1 Airway Equipment and Method: Stylet and Oral airway Placement Confirmation: ETT inserted through vocal cords under direct vision,  positive ETCO2 and breath sounds checked- equal and bilateral Secured at: 21 cm Tube secured with: Tape Dental Injury: Teeth and Oropharynx as per pre-operative assessment  Comments: Elective glidescope given expected airway changes in pregnant patient

## 2017-05-01 NOTE — MAU Note (Signed)
Pt states she was here two weekends ago, was admitted with several abdominal pain, states pain has continued & became much worse yesterday.  States pain is unbearable this morning, denies bleeding or LOF.  Thought she was constipated, has been taking miralax.

## 2017-05-01 NOTE — ED Provider Notes (Signed)
[facility] Provider Note   CSN: [id number] Arrival date & time: 05/01/17  1208     History   Chief Complaint Chief Complaint  Patient presents with  . Abdominal Pain    HPI Laura HammedJelesha Jetter is a 27 y.o. female.  Patient who is approximately [redacted] weeks pregnant presents to the emergency department today with recurrent severe lower abdominal pain without radiation.  Patient was [facility] on 1/5-04/22/17 for abdominal pain.  She underwent CT imaging at that time to rule out appendicitis which did not have signs of appendicitis.  Symptoms attributed to constipation.  Patient was discharged home with pain medication.  She returned to the MAU today with complaint of generalized abdominal pain.  She had workup there including CBC, CMP.  White blood cell count was found to be elevated.  Patient was to be seen by OB, however she left AGAINST MEDICAL ADVICE and [facility].  Patient denies fevers, vaginal bleeding or any discharge of fluid.  No urinary symptoms.  No recent vomiting or diarrhea.  Patient states that the pain does not radiate.  She has generalized pain over her entire abdomen. She has been using oxycodone at home for pain.       Past Medical History:  Diagnosis Date  . Daily headache   . Depression   . Gallstones   . GERD (gastroesophageal reflux disease)   . Sickle cell trait (HCC)   . UTI (lower urinary tract infection) ~ 02/2014   "not recurrent"    Patient Active Problem List   Diagnosis Date Noted  . Abdominal pain during pregnancy in third trimester 04/20/2017  . Gastroenteritis, acute 10/20/2014  . Fever 10/20/2014  . Tachycardia 10/20/2014  . Abdominal pain, acute 10/20/2014  . Acute hypokalemia 10/20/2014  . Leukocytosis 10/20/2014  . Scoliosis 07/12/2014    Past Surgical History:  Procedure Laterality Date  . NO PAST SURGERIES      OB History    Gravida Para Term Preterm AB  Living   2       1     SAB TAB Ectopic Multiple Live Births     1             Home Medications    Prior to Admission medications   Medication Sig Start Date End Date Taking? Authorizing Provider  oxyCODONE (OXY IR/ROXICODONE) 5 MG immediate release tablet Take 1 tablet (5 mg total) by mouth every 6 (six) hours as needed for severe pain. 04/22/17   Bovard-Stuckert, Augusto GambleJody, MD  pantoprazole (PROTONIX) 40 MG tablet Take 40 mg by mouth daily.    [provider]  polyethylene glycol (MIRALAX / GLYCOLAX) packet Take 17 g by mouth daily. 04/22/17   Bovard-Stuckert, Augusto GambleJody, MD  Prenatal Vit-Fe Fumarate-FA (PRENATAL MULTIVITAMIN) TABS tablet Take 1 tablet by mouth daily at 12 noon. 04/22/17   Bovard-StuckertAugusto Gamble, Jody, MD    Family History Family History  Problem Relation Age of Onset  . Healthy Mother   . Arthritis Maternal Grandmother   . Stroke Maternal Grandfather   . Hypertension Maternal Grandfather     Social History Social History   Tobacco Use  . Smoking status: Never Smoker  . Smokeless tobacco: Never Used  Substance Use Topics  . Alcohol use: Yes    Comment: 10/20/2014 "might have 2 glasses of wine once/nmonth"  . Drug use: No     Allergies   Patient has no known allergies.  Review of Systems Review of Systems  Constitutional: Negative for fever.  HENT: Negative for rhinorrhea and sore throat.   Eyes: Negative for redness.  Respiratory: Negative for cough.   Cardiovascular: Negative for chest pain.  Gastrointestinal: Positive for abdominal pain. Negative for diarrhea, nausea and vomiting.  Genitourinary: Negative for dysuria.  Musculoskeletal: Negative for myalgias.  Skin: Negative for rash.  Neurological: Negative for headaches.     Physical Exam Updated Vital Signs BP 111/65   Pulse (!) 110   Temp 99.2 F (37.3 C)   Resp (!) 22   Wt 59 kg (130 lb)   LMP 09/02/2016 (Exact Date)   SpO2 100%   BMI 20.98 kg/m   Physical Exam  Constitutional: She  appears well-developed and well-nourished.  HENT:  Head: Normocephalic and atraumatic.  Eyes: Conjunctivae are normal. Right eye exhibits no discharge. Left eye exhibits no discharge.  Neck: Normal range of motion. Neck supple.  Cardiovascular: Normal rate, regular rhythm and normal heart sounds.  Pulmonary/Chest: Effort normal and breath sounds normal.  Abdominal: Soft. There is no tenderness. There is no rebound and no guarding.  Gravid uterus, roughly consistent with dates.  Neurological: She is alert.  Skin: Skin is warm and dry.  Psychiatric: She has a normal mood and affect.  Nursing note and vitals reviewed.    ED Treatments / Results  Labs (all labs ordered are listed, but only abnormal results are displayed) Labs Reviewed  CBC - Abnormal; Notable for the following components:      Result Value   WBC 21.7 (*)    Hemoglobin 10.1 (*)    HCT 29.3 (*)    MCV 73.8 (*)    MCH 25.4 (*)    RDW 21.8 (*)    All other components within normal limits  GROUP B STREP BY PCR  RPR  I-STAT TROPONIN, ED  TYPE AND SCREEN  GC/CHLAMYDIA PROBE AMP ([employer]) NOT AT [facility]     Procedures Procedures (including critical care time)  Medications Ordered in ED Medications  butorphanol (STADOL) injection 1 mg (not administered)     Initial Impression / Assessment and Plan / ED Course  I have reviewed the triage vital signs and the nursing notes.  Pertinent labs & imaging results that were available during my care of the patient were reviewed by me and considered in my medical decision making (see chart for details).     Patient seen and examined. [facility] at bedside. Cervix reported 1-2 cm. Pt demanding pain medication citing that she received dilaudid during previous admission.   I reviewed lab work from earlier MAU visit and recent hospitalization.   Rapid [facility] has spoken with Dr. Ellyn Hack. Plan: Transfer from [facility] to L&D at [facility]. 1mg  Stadol for pain.   Vital signs reviewed and  are as follows: BP 111/65   Pulse (!) 110   Temp 99.2 F (37.3 C)   Resp (!) 22   Wt 59 kg (130 lb)   LMP 09/02/2016 (Exact Date)   SpO2 100%   BMI 20.98 kg/m     Final Clinical Impressions(s) / ED Diagnoses   Final diagnoses:  Abdominal pain during pregnancy in third trimester   Transfer to [facility].   ED Discharge Orders    None       Renne Crigler, Cordelia Poche 05/01/17 1737    Shaune Pollack, MD 05/01/17 Ayesha Mohair    Shaune Pollack, MD 05/01/17 2326

## 2017-05-01 NOTE — Progress Notes (Signed)
Dr Ellyn HackBovard on L&D unit and informed that pt had significant abdominal pain when removing her from efm.  Also dicussed elevated wbc with MD

## 2017-05-01 NOTE — Progress Notes (Signed)
Dr. Ellyn HackBovard informed pt signed out AMA.

## 2017-05-01 NOTE — Op Note (Signed)
05/01/2017  10:54 PM  PATIENT:  Laura Krause  27 y.o. female  Patient Care Team: Patient, No Pcp Per as PCP - General (General Practice) Patient, No Pcp Per (General Practice)  PRE-OPERATIVE DIAGNOSIS:  abnormal uterine tone  POST-OPERATIVE DIAGNOSIS:  abnormal uterine tone, appendicitis  PROCEDURE:   CESAREAN SECTION APPENDECTOMY  SURGEON:  Surgeon(s): Bovard-Stuckert, Augusto GambleJody, MD Vanita PandaAlicia C Milah Recht, MD   ANESTHESIA:   epidural and general  EBL:  Total I/O In: 1000 [I.V.:1000] Out: 1096 [Urine:100; Blood:996]  DRAINS: none   SPECIMEN:  Source of Specimen:  appendix  DISPOSITION OF SPECIMEN:  PATHOLOGY  COUNTS:  YES  PLAN OF CARE: Admit for overnight observation  PATIENT DISPOSITION:  PACU - hemodynamically stable.  INDICATION: 27 y.o. F who was taken to the OR for emergent C section.  Upon entry into the abdomen the operating MD encountered a large amount of purulence.  It was felt that the patient had appendicitis.   OR FINDINGS: Purulent appendicitis  DESCRIPTION: I was called to the operating room emergently due to findings of abdominal peritonitis with purulence and acute appendicitis.  Upon arrival to the OR the patient was opened with a Pfannenstiel incision.  I evaluated the abdominal cavity.  Cecum was adherent to the abdominal wall.  The appendix was noted to have inflammation in the distal tip.  I mobilized the cecum off of the abdominal wall using blunt dissection.  I was then able to transect the appendix and a cuff of cecum using a GIA blue load 75 mm stapler.  The mesentery of the appendix was taken using 2 Kelly clamps and 2-0 silk suture ties.  The abdomen was then irrigated with warm normal saline.  I turned the case back over to the operating surgeon for closure.  Surgical specimen was sent to pathology.

## 2017-05-01 NOTE — ED Notes (Signed)
EMS at bedside to transport pt.

## 2017-05-01 NOTE — Anesthesia Pain Management Evaluation Note (Signed)
  CRNA Pain Management Visit Note  Patient: Laura Krause, 27 y.o., female  "Hello I am a member of the anesthesia team at [facility]. We have an anesthesia team available at all times to provide care throughout the hospital, including epidural management and anesthesia for C-section. I don't know your plan for the delivery whether it a natural birth, water birth, IV sedation, nitrous supplementation, doula or epidural, but we want to meet your pain goals."   1.Was your pain managed to your expectations on prior hospitalizations?   No prior hospitalizations  2.What is your expectation for pain management during this hospitalization?     Epidural and IV pain meds  3.How can we help you reach that goal? Right T6-7, Left T4  Record the patient's initial score and the patient's pain goal.   Pain: 1  Pain Goal: 3 [facility] wants you to be able to say your pain was always managed very well.  [facility]ly[facility]ERRITT,Laura Krause 05/01/2017

## 2017-05-01 NOTE — Transfer of Care (Signed)
Immediate Anesthesia Transfer of Care Note  Patient: Fredda HammedJelesha Betzler  Procedure(s) Performed: CESAREAN SECTION (N/A ) APPENDECTOMY (N/A )  Patient Location: PACU  Anesthesia Type:General and Epidural  Level of Consciousness: awake, alert  and oriented  Airway & Oxygen Therapy: Patient Spontanous Breathing and Patient connected to nasal cannula oxygen  Post-op Assessment: Report given to RN and Post -op Vital signs reviewed and stable  Post vital signs: Reviewed and stable HR 128, RR 20, RR 20, SaO2 98%, BP 98/53  Last Vitals:  Vitals:   05/01/17 1939 05/01/17 2000  BP: 121/71 121/71  Pulse: (!) 111 (!) 115  Resp: 20 20  Temp: 37 C   SpO2: 99% 100%    Last Pain:  Vitals:   05/01/17 2000  TempSrc:   PainSc: 7          Complications: No apparent anesthesia complications

## 2017-05-01 NOTE — ED Notes (Signed)
FHT 140's, rapid ob remains at the bedside

## 2017-05-01 NOTE — Brief Op Note (Signed)
05/01/2017  11:47 PM  PATIENT:  Fredda HammedJelesha Klingel  27 y.o. female  PRE-OPERATIVE DIAGNOSIS:  abnormal uterine tone  POST-OPERATIVE DIAGNOSIS:  abnormla uterine tone, peritonitis from ruptured appendicitis  PROCEDURE:  Procedure(s): CESAREAN SECTION (N/A) APPENDECTOMY (N/A)  FINDINGS: viable preterm female, 4#0.2oz, apgars 5/6/8, 21:43.  Ruptured appendix - adherent in R adnexa.    SURGEON:  Surgeon(s) and Role:    * Bovard-Stuckert, Lindel Marcell, MD - Primary Romie Leveehomas, Alicia, MD Duane LopeEure, Luther MD  ANESTHESIA:   epidural and general  EBL:  996 mL   BLOOD ADMINISTERED:none  DRAINS: Urinary Catheter (Foley)   LOCAL MEDICATIONS USED:  NONE  SPECIMEN:  Source of Specimen:  placenta to pathology, appendix  DISPOSITION OF SPECIMEN:  PATHOLOGY  COUNTS:  YES  TOURNIQUET:  * No tourniquets in log *  DICTATION: .Other Dictation: Dictation Number [id number]  PLAN OF CARE: Admit to inpatient   PATIENT DISPOSITION:  PACU - hemodynamically stable.   Delay start of Pharmacological VTE agent (>24hrs) due to surgical blood loss or risk of bleeding: not applicable

## 2017-05-01 NOTE — MAU Provider Note (Signed)
Chief Complaint:  No chief complaint on file.   First Provider Initiated Contact with Patient 05/01/17 [phone]      HPI: Laura Krause is a 27 y.o. G2P0010 at 34w3dwho presents to maternity admissions reporting abdominal pain. She reports being sent home from work last night around midnight for abdominal pain, states the pain got worse when she got home and when she tries to lay on her right side. The pain is specific to the right lower abdomen and radiates to the lower abdomen. She reports the pain is intermittent and gets worse every "couple of minutes". She reports being here last week and admitted for observation then sent home on 04/22/2017. She reports having nausea that started last night with the pain but denies vomiting. She denies taken any medication for abdominal pain or nausea. She denies IC in the last 24 hours. She reports good fetal movement, denies LOF, vaginal bleeding, vaginal itching/burning, urinary symptoms, h/a, dizziness or fever/chills.    Past Medical History: Past Medical History:  Diagnosis Date  . Daily headache   . Depression   . Gallstones   . GERD (gastroesophageal reflux disease)   . Sickle cell trait (HCC)   . UTI (lower urinary tract infection) ~ 02/2014   "not recurrent"    Past obstetric history: OB History  Gravida Para Term Preterm AB Living  2       1    SAB TAB Ectopic Multiple Live Births    1          # Outcome Date GA Lbr Len/2nd Weight Sex Delivery Anes PTL Lv  2 Current           1 TAB 2015              Past Surgical History: Past Surgical History:  Procedure Laterality Date  . NO PAST SURGERIES      Family History: Family History  Problem Relation Age of Onset  . Healthy Mother   . Arthritis Maternal Grandmother   . Stroke Maternal Grandfather   . Hypertension Maternal Grandfather     Social History: Social History   Tobacco Use  . Smoking status: Never Smoker  . Smokeless tobacco: Never Used  Substance Use Topics  .  Alcohol use: Yes    Comment: 10/20/2014 "might have 2 glasses of wine once/nmonth"  . Drug use: No    Allergies: No Known Allergies  Meds:  Medications Prior to Admission  Medication Sig Dispense Refill Last Dose  . acetaminophen (TYLENOL) 325 MG tablet Take 650 mg by mouth every 6 (six) hours as needed for headache.   Past Month at Unknown time  . oxyCODONE (OXY IR/ROXICODONE) 5 MG immediate release tablet Take 1 tablet (5 mg total) by mouth every 6 (six) hours as needed for severe pain. 10 tablet 0   . pantoprazole (PROTONIX) 40 MG tablet Take 40 mg by mouth daily.   04/19/2017 at Unknown time  . polyethylene glycol (MIRALAX / GLYCOLAX) packet Take 17 g by mouth daily. 30 each 1   . Prenatal Vit-Fe Fumarate-FA (PRENATAL MULTIVITAMIN) TABS tablet Take 1 tablet by mouth daily at 12 noon. 100 tablet 1     ROS:  Review of Systems  Constitutional: Negative.   Respiratory: Negative.   Cardiovascular: Negative.   Gastrointestinal: Positive for abdominal pain and nausea. Negative for constipation, diarrhea and vomiting.  Genitourinary: Negative.   Musculoskeletal: Negative.   Neurological: Negative.   Psychiatric/Behavioral: Negative.    I have reviewed  patient's Past Medical Hx, Surgical Hx, Family Hx, Social Hx, medications and allergies.   Physical Exam   Patient Vitals for the past 24 hrs:  BP Temp Temp src Pulse Resp Height Weight  05/01/17 1004 111/78 - - (!) 122 - - -  05/01/17 0826 - - - - - 5\' 6"  (1.676 m) 130 lb (59 kg)  05/01/17 0824 116/81 98.4 F (36.9 C) Oral (!) 114 (!) 22 - -   Constitutional: Well-developed, well-nourished female in mild acute distress.  Cardiovascular: normal rate, pulse elevation due to hyperventilation  Respiratory: normal effort, hyperventilation  GI: Abd soft, tender in right lower abdomen, gravid appropriate for gestational age.  MS: Extremities nontender, no edema, normal ROM Neurologic: Alert and oriented x 4.  GU: Neg CVAT. Cervical Exam:   Dilation: Fingertip(externally 1 ) Effacement (%): 90 Cervical Position: Posterior Station: -1 Presentation: Vertex Exam by:: Lanice Shirts CNM  FHT:  Baseline 150 , moderate variability, accelerations present, no decelerations Contractions: q 1-4 minutes   Labs: Results for orders placed or performed during the hospital encounter of 05/01/17 (from the past 24 hour(s))  Fetal fibronectin     Status: None   Collection Time: 05/01/17  8:32 AM  Result Value Ref Range   Fetal Fibronectin NEGATIVE NEGATIVE  CBC     Status: Abnormal   Collection Time: 05/01/17  8:45 AM  Result Value Ref Range   WBC 18.3 (H) 4.0 - 10.5 K/uL   RBC 4.23 3.87 - 5.11 MIL/uL   Hemoglobin 10.6 (L) 12.0 - 15.0 g/dL   HCT 56.2 (L) 13.0 - 86.5 %   MCV 73.5 (L) 78.0 - 100.0 fL   MCH 25.1 (L) 26.0 - 34.0 pg   MCHC 34.1 30.0 - 36.0 g/dL   RDW 78.4 (H) 69.6 - 29.5 %   Platelets 274 150 - 400 K/uL  Comprehensive metabolic panel     Status: Abnormal   Collection Time: 05/01/17  8:45 AM  Result Value Ref Range   Sodium 131 (L) 135 - 145 mmol/L   Potassium 3.9 3.5 - 5.1 mmol/L   Chloride 102 101 - 111 mmol/L   CO2 19 (L) 22 - 32 mmol/L   Glucose, Bld 85 65 - 99 mg/dL   BUN 6 6 - 20 mg/dL   Creatinine, Ser 2.84 (L) 0.44 - 1.00 mg/dL   Calcium 8.8 (L) 8.9 - 10.3 mg/dL   Total Protein 7.2 6.5 - 8.1 g/dL   Albumin 3.6 3.5 - 5.0 g/dL   AST 21 15 - 41 U/L   ALT 15 14 - 54 U/L   Alkaline Phosphatase 160 (H) 38 - 126 U/L   Total Bilirubin 1.4 (H) 0.3 - 1.2 mg/dL   GFR calc non Af Amer >60 >60 mL/min   GFR calc Af Amer >60 >60 mL/min   Anion gap 10 5 - 15  Amylase     Status: None   Collection Time: 05/01/17  8:45 AM  Result Value Ref Range   Amylase 50 28 - 100 U/L  Lipase, blood     Status: None   Collection Time: 05/01/17  8:45 AM  Result Value Ref Range   Lipase 21 11 - 51 U/L  Differential     Status: None   Collection Time: 05/01/17  8:45 AM  Result Value Ref Range   Neutrophils Relative % 79 %    Neutro Abs 14.8 1.7 - 7.7 K/uL   Lymphocytes Relative 11 %   Lymphs Abs 2.2  0.7 - 4.0 K/uL   Monocytes Relative 10 %   Monocytes Absolute 1.8 0.1 - 1.0 K/uL   Eosinophils Relative 0 %   Eosinophils Absolute 0.1 0.0 - 0.7 K/uL   Basophils Relative 0 %   Basophils Absolute 0.0 0.0 - 0.1 K/uL  Urinalysis, Routine w reflex microscopic     Status: Abnormal   Collection Time: 05/01/17  9:06 AM  Result Value Ref Range   Color, Urine YELLOW YELLOW   APPearance CLEAR CLEAR   Specific Gravity, Urine 1.012 1.005 - 1.030   pH 7.0 5.0 - 8.0   Glucose, UA NEGATIVE NEGATIVE mg/dL   Hgb urine dipstick NEGATIVE NEGATIVE   Bilirubin Urine NEGATIVE NEGATIVE   Ketones, ur 5 (A) NEGATIVE mg/dL   Protein, ur NEGATIVE NEGATIVE mg/dL   Nitrite NEGATIVE NEGATIVE   Leukocytes, UA NEGATIVE NEGATIVE   --/--/O POS (01/05 1415)  MAU Course/MDM: Orders Placed This Encounter  Procedures  . CBC  . Comprehensive metabolic panel  . Amylase  . Lipase, blood  . Fetal fibronectin  . Urinalysis, Routine w reflex microscopic  . Differential  . Insert peripheral IV  UA- negative  CBC- elevated WBC, normal diff   FFN- negative   Meds ordered this encounter  Medications  . NIFEdipine (PROCARDIA) capsule 10 mg  . lactated ringers bolus 1,000 mL  . fentaNYL (SUBLIMAZE) injection 50 mcg  . lactated ringers infusion  . fentaNYL (SUBLIMAZE) injection 100 mcg   NST reviewed Treatments in MAU included Procardia 10mg x3 doses, 2,05200mL LR IV, and Fentanyl IV x2 doses.  C/w Dr Ellyn HackBovard with presentation, exam findings and test results. Recommends 4th procardia dose and recheck of cervix in 1 hr.   Patient family combative at nurses and provider. Request to see Dr Ellyn HackBovard for continuation of care. Dr Ellyn HackBovard called for request. Dr Ellyn HackBovard agrees to come see patient and plans to come see patient during break from office appointments. Patient and family aware of plan.   1145: Patient combative with RN and asks to leave  hospital. Mother states that she has informed [facility] of situation, is worried about infection and is not getting necessary care. Discussed with patient and family that Dr Ellyn HackBovard is coming to see her and leaving is not recommended for patient with continued pain and contractions  1147: Patient signed out AMA. Mother states she is not getting necessary care and [facility]   Assessment: 1. Preterm uterine contractions in third trimester, antepartum 2. Abdominal pain during pregnancy in third trimester   Steward DroneVeronica Shunda Rabadi Certified Nurse-Midwife 05/01/2017 12:11 PM

## 2017-05-01 NOTE — ED Notes (Signed)
Pt denies any vaginal bleeding or any fluids leaking

## 2017-05-02 ENCOUNTER — Other Ambulatory Visit: Payer: Self-pay

## 2017-05-02 ENCOUNTER — Encounter (HOSPITAL_COMMUNITY): Payer: Self-pay

## 2017-05-02 LAB — CBC
HEMATOCRIT: 20.5 % — AB (ref 36.0–46.0)
Hemoglobin: 7.3 g/dL — ABNORMAL LOW (ref 12.0–15.0)
MCH: 26.3 pg (ref 26.0–34.0)
MCHC: 35.6 g/dL (ref 30.0–36.0)
MCV: 73.7 fL — ABNORMAL LOW (ref 78.0–100.0)
PLATELETS: 205 10*3/uL (ref 150–400)
RBC: 2.78 MIL/uL — ABNORMAL LOW (ref 3.87–5.11)
RDW: 21.7 % — ABNORMAL HIGH (ref 11.5–15.5)
WBC: 24.8 10*3/uL — ABNORMAL HIGH (ref 4.0–10.5)

## 2017-05-02 LAB — RPR: RPR Ser Ql: NONREACTIVE

## 2017-05-02 MED ORDER — KETOROLAC TROMETHAMINE 30 MG/ML IJ SOLN: 30.0000 mg | Freq: Four times a day (QID) | INTRAMUSCULAR | Status: AC | PRN

## 2017-05-02 MED ORDER — OXYTOCIN 40 UNITS IN LACTATED RINGERS INFUSION - SIMPLE MED
2.5000 [IU]/h | INTRAVENOUS | Status: AC
  Administered 2017-05-02: 2.5 [IU]/h via INTRAVENOUS

## 2017-05-02 MED ORDER — ACETAMINOPHEN 500 MG PO TABS
1000.0000 mg | ORAL_TABLET | Freq: Four times a day (QID) | ORAL | Status: AC
  Administered 2017-05-02 (×4): 1000 mg via ORAL
  Filled 2017-05-02 (×4): qty 2

## 2017-05-02 MED ORDER — NALBUPHINE HCL 10 MG/ML IJ SOLN: 5.0000 mg | Freq: Once | INTRAMUSCULAR | Status: DC | PRN

## 2017-05-02 MED ORDER — PIPERACILLIN-TAZOBACTAM 3.375 G IVPB 30 MIN: 3.3750 g | Freq: Three times a day (TID) | INTRAVENOUS | Status: DC

## 2017-05-02 MED ORDER — ACETAMINOPHEN 325 MG PO TABS
650.0000 mg | ORAL_TABLET | ORAL | Status: DC | PRN
  Administered 2017-05-03: 650 mg via ORAL
  Filled 2017-05-02 (×2): qty 2

## 2017-05-02 MED ORDER — SIMETHICONE 80 MG PO CHEW
80.0000 mg | CHEWABLE_TABLET | Freq: Three times a day (TID) | ORAL | Status: DC
  Administered 2017-05-02 – 2017-05-04 (×6): 80 mg via ORAL
  Filled 2017-05-02 (×6): qty 1

## 2017-05-02 MED ORDER — NALOXONE HCL 4 MG/10ML IJ SOLN
1.0000 ug/kg/h | INTRAVENOUS | Status: DC | PRN
  Filled 2017-05-02: qty 5

## 2017-05-02 MED ORDER — SCOPOLAMINE 1 MG/3DAYS TD PT72
1.0000 | MEDICATED_PATCH | Freq: Once | TRANSDERMAL | Status: DC
  Filled 2017-05-02: qty 1

## 2017-05-02 MED ORDER — PANTOPRAZOLE SODIUM 40 MG PO TBEC
40.0000 mg | DELAYED_RELEASE_TABLET | Freq: Every day | ORAL | Status: DC
  Administered 2017-05-03 – 2017-05-04 (×2): 40 mg via ORAL
  Filled 2017-05-02 (×2): qty 1

## 2017-05-02 MED ORDER — DIPHENHYDRAMINE HCL 25 MG PO CAPS
25.0000 mg | ORAL_CAPSULE | Freq: Four times a day (QID) | ORAL | Status: DC | PRN
  Administered 2017-05-02: 25 mg via ORAL
  Filled 2017-05-02 (×2): qty 1

## 2017-05-02 MED ORDER — FENTANYL CITRATE (PF) 100 MCG/2ML IJ SOLN: 25.0000 ug | INTRAMUSCULAR | Status: DC | PRN

## 2017-05-02 MED ORDER — NALBUPHINE HCL 10 MG/ML IJ SOLN: 5.0000 mg | INTRAMUSCULAR | Status: DC | PRN

## 2017-05-02 MED ORDER — DIPHENHYDRAMINE HCL 25 MG PO CAPS
25.0000 mg | ORAL_CAPSULE | ORAL | Status: DC | PRN
  Administered 2017-05-03: 25 mg via ORAL
  Filled 2017-05-02: qty 1

## 2017-05-02 MED ORDER — PRENATAL MULTIVITAMIN CH
1.0000 | ORAL_TABLET | Freq: Every day | ORAL | Status: DC
  Administered 2017-05-02 – 2017-05-04 (×3): 1 via ORAL
  Filled 2017-05-02 (×3): qty 1

## 2017-05-02 MED ORDER — LACTATED RINGERS IV SOLN
INTRAVENOUS | Status: DC
  Administered 2017-05-03: 22:00:00 via INTRAVENOUS

## 2017-05-02 MED ORDER — NALOXONE HCL 0.4 MG/ML IJ SOLN: 0.4000 mg | INTRAMUSCULAR | Status: DC | PRN

## 2017-05-02 MED ORDER — COCONUT OIL OIL
1.0000 "application " | TOPICAL_OIL | Status: DC | PRN
  Administered 2017-05-04: 1 via TOPICAL
  Filled 2017-05-02: qty 120

## 2017-05-02 MED ORDER — SIMETHICONE 80 MG PO CHEW: 80.0000 mg | CHEWABLE_TABLET | ORAL | Status: DC | PRN

## 2017-05-02 MED ORDER — DIBUCAINE 1 % RE OINT: 1.0000 "application " | TOPICAL_OINTMENT | RECTAL | Status: DC | PRN

## 2017-05-02 MED ORDER — TETANUS-DIPHTH-ACELL PERTUSSIS 5-2.5-18.5 LF-MCG/0.5 IM SUSP: 0.5000 mL | Freq: Once | INTRAMUSCULAR | Status: DC

## 2017-05-02 MED ORDER — SODIUM CHLORIDE 0.9% FLUSH
3.0000 mL | INTRAVENOUS | Status: DC | PRN
  Administered 2017-05-03: 3 mL via INTRAVENOUS
  Filled 2017-05-02: qty 3

## 2017-05-02 MED ORDER — PROMETHAZINE HCL 25 MG/ML IJ SOLN: 6.2500 mg | INTRAMUSCULAR | Status: DC | PRN

## 2017-05-02 MED ORDER — IBUPROFEN 800 MG PO TABS
800.0000 mg | ORAL_TABLET | Freq: Three times a day (TID) | ORAL | Status: DC
  Administered 2017-05-02 – 2017-05-04 (×9): 800 mg via ORAL
  Filled 2017-05-02 (×9): qty 1

## 2017-05-02 MED ORDER — ZOLPIDEM TARTRATE 5 MG PO TABS: 5.0000 mg | ORAL_TABLET | Freq: Every evening | ORAL | Status: DC | PRN

## 2017-05-02 MED ORDER — WITCH HAZEL-GLYCERIN EX PADS: 1.0000 "application " | MEDICATED_PAD | CUTANEOUS | Status: DC | PRN

## 2017-05-02 MED ORDER — MENTHOL 3 MG MT LOZG: 1.0000 | LOZENGE | OROMUCOSAL | Status: DC | PRN

## 2017-05-02 MED ORDER — OXYCODONE HCL 5 MG PO TABS
5.0000 mg | ORAL_TABLET | Freq: Once | ORAL | Status: DC | PRN
  Filled 2017-05-02: qty 1

## 2017-05-02 MED ORDER — ONDANSETRON HCL 4 MG/2ML IJ SOLN: 4.0000 mg | Freq: Three times a day (TID) | INTRAMUSCULAR | Status: DC | PRN

## 2017-05-02 MED ORDER — OXYCODONE HCL 5 MG/5ML PO SOLN: 5.0000 mg | Freq: Once | ORAL | Status: DC | PRN

## 2017-05-02 MED ORDER — PIPERACILLIN-TAZOBACTAM 3.375 G IVPB
3.3750 g | Freq: Three times a day (TID) | INTRAVENOUS | Status: DC
  Administered 2017-05-02 – 2017-05-03 (×4): 3.375 g via INTRAVENOUS
  Filled 2017-05-02 (×5): qty 50

## 2017-05-02 MED ORDER — OXYCODONE HCL 5 MG PO TABS
5.0000 mg | ORAL_TABLET | ORAL | Status: DC | PRN
  Administered 2017-05-03: 5 mg via ORAL

## 2017-05-02 MED ORDER — SIMETHICONE 80 MG PO CHEW
80.0000 mg | CHEWABLE_TABLET | ORAL | Status: DC
  Administered 2017-05-02 – 2017-05-03 (×2): 80 mg via ORAL
  Filled 2017-05-02 (×2): qty 1

## 2017-05-02 MED ORDER — SENNOSIDES-DOCUSATE SODIUM 8.6-50 MG PO TABS
2.0000 | ORAL_TABLET | ORAL | Status: DC
  Administered 2017-05-02 – 2017-05-03 (×2): 2 via ORAL
  Filled 2017-05-02 (×2): qty 2

## 2017-05-02 MED ORDER — OXYCODONE HCL 5 MG PO TABS
10.0000 mg | ORAL_TABLET | ORAL | Status: DC | PRN
  Administered 2017-05-03: 10 mg via ORAL
  Filled 2017-05-02: qty 2

## 2017-05-02 MED ORDER — DIPHENHYDRAMINE HCL 50 MG/ML IJ SOLN: 12.5000 mg | INTRAMUSCULAR | Status: DC | PRN

## 2017-05-02 NOTE — Op Note (Deleted)
  The note originally documented on this encounter has been moved the the encounter in which it belongs.  

## 2017-05-02 NOTE — Progress Notes (Signed)
[facility] Progress Note  1 Day Post-Op  Subjective: CC-  Patient just arrived back to room from NICU visiting child.  States that she is sore but preop pain is much improved. Denies n/v. Tolerating liquids. No flatus or BM.  Objective: Vital signs in last 24 hours: Temp:  [98.2 F (36.8 C)-100.2 F (37.9 C)] 98.3 F (36.8 C) (01/17 0745) Pulse Rate:  [63-126] 80 (01/17 0745) Resp:  [15-22] 18 (01/17 0745) BP: (95-123)/(47-76) 100/50 (01/17 0745) SpO2:  [98 %-100 %] 100 % (01/17 0745) Weight:  [130 lb (59 kg)] 130 lb (59 kg) (01/16 1411)    Intake/Output from previous day: 01/16 0701 - 01/17 0700 In: 5165.8 [P.O.:420; I.V.:4495.8; IV Piggyback:250] Out: 3721 [Urine:2725; Blood:996] Intake/Output this shift: No intake/output data recorded.  PE: Gen:  Alert, NAD, pleasant HEENT: EOM's intact, pupils equal and round Card:  RRR, no M/G/R heard Pulm:  CTAB, no W/R/R, effort normal Abd: Soft, ND, appropriately tender, +BS, lower transverse incision cdi with honeycomb dressing in place Psych: A&Ox3  Skin: no rashes noted, warm and dry  Lab Results:  Recent Labs    05/01/17 1602 05/02/17 0544  WBC 21.7* 24.8*  HGB 10.1* 7.3*  HCT 29.3* 20.5*  PLT 264 205   BMET Recent Labs    05/01/17 0845  NA 131*  K 3.9  CL 102  CO2 19*  GLUCOSE 85  BUN 6  CREATININE 0.38*  CALCIUM 8.8*   PT/INR No results for input(s): LABPROT, INR in the last 72 hours. CMP     Component Value Date/Time   NA 131 (L) 05/01/2017 0845   K 3.9 05/01/2017 0845   CL 102 05/01/2017 0845   CO2 19 (L) 05/01/2017 0845   GLUCOSE 85 05/01/2017 0845   BUN 6 05/01/2017 0845   CREATININE 0.38 (L) 05/01/2017 0845   CALCIUM 8.8 (L) 05/01/2017 0845   PROT 7.2 05/01/2017 0845   ALBUMIN 3.6 05/01/2017 0845   AST 21 05/01/2017 0845   ALT 15 05/01/2017 0845   ALKPHOS 160 (H) 05/01/2017 0845   BILITOT 1.4 (H) 05/01/2017 0845   GFRNONAA >60 05/01/2017 0845   GFRAA >60 05/01/2017  0845   Lipase     Component Value Date/Time   LIPASE 21 05/01/2017 0845       Studies/Results: No results found.  Anti-infectives: Anti-infectives (From admission, onward)   Start     Dose/Rate Route Frequency Ordered Stop   05/02/17 0600  piperacillin-tazobactam (ZOSYN) IVPB 3.375 g     3.375 g 12.5 mL/hr over 240 Minutes Intravenous Every 8 hours 05/02/17 0108     05/02/17 0115  piperacillin-tazobactam (ZOSYN) IVPB 3.375 g  Status:  Discontinued     3.375 g 100 mL/hr over 30 Minutes Intravenous Every 8 hours 05/02/17 0103 05/02/17 0106   05/01/17 2230  piperacillin-tazobactam (ZOSYN) IVPB 3.375 g     3.375 g 100 mL/hr over 30 Minutes Intravenous  Once 05/01/17 2219 05/01/17 2235   05/01/17 2215  piperacillin-tazobactam (ZOSYN) IVPB 3.375 g  Status:  Discontinued     3.375 g 100 mL/hr over 30 Minutes Intravenous  Once 05/01/17 2207 05/01/17 2219   05/01/17 2000  penicillin G potassium 3 Million Units in dextrose 50mL IVPB  Status:  Discontinued     3 Million Units 100 mL/hr over 30 Minutes Intravenous Every 4 hours 05/01/17 1546 05/02/17 0033   05/01/17 1600  penicillin G potassium 5 Million Units in dextrose 5 % 250 mL IVPB  5 Million Units 250 mL/hr over 60 Minutes Intravenous  Once 05/01/17 1546 05/01/17 1707       Assessment/Plan GERD  Preterm delivery at ~34 weeks (05/01/17)  Abnormal uterine tone, appendicitis S/p CESAREAN SECTION and APPENDECTOMY 1/16 Dr. Maisie Fushomas and Dr. Hinton RaoBovard-Stuckert - POD 1 - pain controlled, tolerating liquids - Patient is going to try a regular diet for lunch. Recommend continuing IV antibiotics during admission, then transition to oral antibiotics when ready for discharge to complete 7 total days of antibiotic therapy. Patient may be discharged when cleared by ob/gyn; from our standpoint she needs to be tolerating a diet, pain controlled on oral medications, ambulating well, urinating, having flatus. Patient will follow up with Dr.  Maisie Fushomas in about 2 weeks (information on AVS).  ID - zosyn 1/16>>day#2 FEN - IVF, regular diet VTE - SCDs Foley - none Follow up - Dr. Maisie Fushomas   LOS: 1 day    Franne FortsBrooke A Meuth , [facility] 05/02/2017, 11:48 AM Pager: [phone] Consults: [phone] Mon-Fri 7:00 am-4:30 pm Sat-Sun 7:00 am-11:30 am

## 2017-05-02 NOTE — OR Nursing (Signed)
Dr. Mal AmabileBrock notified of tachycardia, stable BP, neuro status regarding epidural, low grade fever-May transfer to floor.

## 2017-05-02 NOTE — Op Note (Signed)
NAMFredda Hammed:  Tappen, Torrin              ACCOUNT NO.:  [id number]  MEDICAL RECORD NO.:  [id number]  LOCATION:                                 FACILITY:  PHYSICIAN:  Sherron MondayJody Bovard, MD        DATE OF BIRTH:  11/25/1990  DATE OF PROCEDURE:  05/01/2017 DATE OF DISCHARGE:  05/04/2017                              OPERATIVE REPORT   PREOPERATIVE DIAGNOSIS:  Abnormal uterine tone, pain despite epidural.  POSTOPERATIVE DIAGNOSES:  Abnormal uterine tone, pain despite epidural, peritonitis from ruptured appendicitis, delivered.  PROCEDURE:  Low-transverse cesarean section, open appendectomy.  FINDINGS:  Viable preterm female infant at 2143 with Apgars of 5, 6, and 8 at 1, 5, and 10 minutes and a weight of 4 pounds 0.2 ounces.  Also, ruptured appendix with adherent right bowel to the sidewall and tube and ovary.  SURGEONS: Sherron MondayBovard, Betzalel Umbarger MD ASSISTANTS: 1. Romie LeveeAlicia Thomas, MD. 2. Lazaro ArmsLuther H. Eure, MD.  ANESTHESIA:  Epidural, converted to general endotracheal.  ESTIMATED BLOOD LOSS:  996 mL.  INTRAVENOUS FLUID AND URINE OUTPUT:  Per anesthesia note.  PATHOLOGY:  Placenta and appendix to Pathology.  COMPLICATIONS:  Ruptured right appendix adherent to the right sidewall, bowel, appendix, tube, and ovary.  DESCRIPTION OF PROCEDURE:  After informed consent was reviewed with the patient and her mother, she was transported to the operating room, placed on the table in supine position with a leftward tilt.  Her epidural had been dosed and was found to be adequate to perform a cesarean section.  She was prepped and draped in the normal sterile fashion.  A Foley catheter had previously been placed.  A Pfannenstiel skin incision was made at the level of 2 fingerbreadths above the pubic symphysis, carried through the underlying layer of fascia sharply.  The fascia was incised in the midline and the midline incision was extended laterally with Mayo scissors.  Superior aspect of the fascial incision was  grasped with Kocher clamps, elevated and the rectus muscles were dissected off both bluntly and sharply.  Midline was easily identified. The peritoneum was entered bluntly with entry into the peritoneum. There was a 5-10 mL of pus noted.  The uterus was easily identified. The Alexis skin retractor was placed carefully making sure that no bowel was entrapped.  The uterus was incised in a transverse fashion.  The incision was extended manually.  Infant was delivered with the aid of a vacuum from the vertex presentation at the time of rupture of membranes. The fluid was clear.  The infant was delivered.  It was awaited a minute to have the cord clamped.  The infant was then handed off to the waiting NICU staff.  The placenta was expressed from the uterus.  The uterus was cleared of all clot and debris.  Uterine incision was closed with 2 layers of 0 Vicryl, the first of which was a running locked and the second was an imbricating suture and initially the left adnexa was explored.  The tube and ovary were normal.  Attention was then turned to the right adnexa and exudate was noted on the right side.  The tube and ovary were adherent and at this point  Dr. Despina Hidden was asked to assist in the case.  He helped me to identify the appendix.  After identification of the appendix and noted rupture of the appendix, General Surgery was called.  She agreed to come and perform an appendectomy with assistance of the [place] surgeon.  She quickly arrived and performed the appendectomy. Please see her separately dictated note.  After the procedure was performed, a copious washout of the pelvis was performed.  The peritoneum was reapproximated with 2-0 Vicryl in a running fashion.  The subfascial planes were inspected and found to be hemostatic.  The fascia was closed with a single suture of 0 Vicryl.  The subcuticular adipose layer was reapproximated with 2-0 plain gut.  The skin was closed with 4- 0 Vicryl on a  Keith needle.  Benzoin and Steri-Strips were applied.  The patient tolerated the procedure well.  Sponge, lap, and needle count was correct x2 per the operating staff.     Sherron Monday, MD     JB/MEDQ  D:  05/01/2017  T:  05/02/2017  Job:  [id number]

## 2017-05-02 NOTE — Anesthesia Postprocedure Evaluation (Signed)
Anesthesia Post Note  Patient: Laura HammedJelesha Krause  Procedure(s) Performed: CESAREAN SECTION (N/A ) APPENDECTOMY (N/A )     Patient location during evaluation: Mother Baby Anesthesia Type: Epidural Level of consciousness: awake and alert and oriented Pain management: pain level controlled Vital Signs Assessment: post-procedure vital signs reviewed and stable Respiratory status: spontaneous breathing and nonlabored ventilation Cardiovascular status: stable Postop Assessment: no headache, no backache, patient able to bend at knees, no signs of nausea or vomiting and adequate PO intake Anesthetic complications: no    Last Vitals:  Vitals:   05/02/17 0603 05/02/17 0605  BP: (!) 100/57 96/60  Pulse: 85 88  Resp: 16 16  Temp: 37.2 C 37.2 C  SpO2: 100% 100%    Last Pain:  Vitals:   05/02/17 0646  TempSrc:   PainSc: 0-No pain   Pain Goal:                 Madison HickmanGREGORY,Avriana Joo

## 2017-05-02 NOTE — Progress Notes (Signed)
Patient set up with DEBP.  She was instructed on use.

## 2017-05-02 NOTE — Anesthesia Postprocedure Evaluation (Signed)
Anesthesia Post Note  Patient: Laura Krause  Procedure(s) Performed: CESAREAN SECTION (N/A ) APPENDECTOMY (N/A )     Patient location during evaluation: PACU Anesthesia Type: Epidural Level of consciousness: awake and alert Pain management: pain level controlled Vital Signs Assessment: post-procedure vital signs reviewed and stable Respiratory status: spontaneous breathing, nonlabored ventilation and respiratory function stable Cardiovascular status: blood pressure returned to baseline and stable Postop Assessment: no apparent nausea or vomiting and epidural receding Anesthetic complications: no    Last Vitals:  Vitals:   05/02/17 0038 05/02/17 0127  BP: 108/64 107/61  Pulse: (!) 116 (!) 104  Resp: 15 16  Temp: 37.2 C 37.2 C  SpO2: 100% 99%    Last Pain:  Vitals:   05/02/17 0127  TempSrc: Oral  PainSc:    Pain Goal:                 [name]

## 2017-05-02 NOTE — Progress Notes (Signed)
Subjective: Postpartum Day #1: Cesarean Delivery and appendectomy for appendicitis Patient reports incisional pain and tolerating PO.  Pre-op RLQ pain much improved  Objective: Vital signs in last 24 hours: Temp:  [98.2 F (36.8 C)-100.2 F (37.9 C)] 98.3 F (36.8 C) (01/17 0745) Pulse Rate:  [63-126] 80 (01/17 0745) Resp:  [15-22] 18 (01/17 0745) BP: (95-123)/(47-78) 100/50 (01/17 0745) SpO2:  [98 %-100 %] 100 % (01/17 0745) Weight:  [130 lb (59 kg)] 130 lb (59 kg) (01/16 1411)  Physical Exam:  General: alert Lochia: appropriate Uterine Fundus: firm Incision: dressing C/D/I   Recent Labs    05/01/17 1602 05/02/17 0544  HGB 10.1* 7.3*  HCT 29.3* 20.5*  WBC 21.7 to 24.8  Assessment/Plan: Status post Cesarean section and appendectomy with peritonitis. Doing well postoperatively. Continue current care, continue Zosyn at least today, ambulate, recheck CBC tomorrow am.  Zenaida Nieceodd D Laurelyn Terrero 05/02/2017, 8:59 AM

## 2017-05-02 NOTE — Addendum Note (Signed)
Addendum  created 05/02/17 0750 by Shanon PayorGregory, Minetta Krisher M, CRNA   Sign clinical note

## 2017-05-03 LAB — CBC WITH DIFFERENTIAL/PLATELET
BASOS ABS: 0 10*3/uL (ref 0.0–0.1)
Basophils Relative: 0 %
Eosinophils Absolute: 0 10*3/uL (ref 0.0–0.7)
Eosinophils Relative: 0 %
HEMATOCRIT: 18.7 % — AB (ref 36.0–46.0)
Hemoglobin: 6.5 g/dL — CL (ref 12.0–15.0)
LYMPHS ABS: 1.4 10*3/uL (ref 0.7–4.0)
LYMPHS PCT: 7 %
MCH: 25.6 pg — ABNORMAL LOW (ref 26.0–34.0)
MCHC: 34.8 g/dL (ref 30.0–36.0)
MCV: 73.6 fL — AB (ref 78.0–100.0)
MONO ABS: 1 10*3/uL (ref 0.1–1.0)
Monocytes Relative: 6 %
NEUTROS ABS: 16.2 10*3/uL — AB (ref 1.7–7.7)
Neutrophils Relative %: 87 %
Platelets: 189 10*3/uL (ref 150–400)
RBC: 2.54 MIL/uL — AB (ref 3.87–5.11)
RDW: 21.8 % — ABNORMAL HIGH (ref 11.5–15.5)
WBC: 18.6 10*3/uL — AB (ref 4.0–10.5)

## 2017-05-03 LAB — PREPARE RBC (CROSSMATCH)

## 2017-05-03 MED ORDER — SODIUM CHLORIDE 0.9 % IV SOLN
Freq: Once | INTRAVENOUS | Status: AC
  Administered 2017-05-03: 12:00:00 via INTRAVENOUS

## 2017-05-03 MED ORDER — OXYCODONE HCL 5 MG PO TABS: 5.0000 mg | ORAL_TABLET | ORAL | Status: DC | PRN

## 2017-05-03 MED ORDER — PIPERACILLIN-TAZOBACTAM 3.375 G IVPB
3.3750 g | Freq: Three times a day (TID) | INTRAVENOUS | Status: DC
  Administered 2017-05-03 – 2017-05-04 (×2): 3.375 g via INTRAVENOUS
  Filled 2017-05-03 (×5): qty 50

## 2017-05-03 MED ORDER — OXYCODONE HCL 5 MG PO TABS
5.0000 mg | ORAL_TABLET | ORAL | Status: DC | PRN
  Administered 2017-05-03 – 2017-05-04 (×3): 5 mg via ORAL
  Filled 2017-05-03 (×3): qty 1

## 2017-05-03 MED ORDER — AMOXICILLIN-POT CLAVULANATE 875-125 MG PO TABS
1.0000 | ORAL_TABLET | Freq: Two times a day (BID) | ORAL | Status: DC
  Administered 2017-05-03: 1 via ORAL
  Filled 2017-05-03: qty 1

## 2017-05-03 MED ORDER — ACETAMINOPHEN 500 MG PO TABS
1000.0000 mg | ORAL_TABLET | Freq: Three times a day (TID) | ORAL | Status: DC
  Administered 2017-05-03 – 2017-05-04 (×3): 1000 mg via ORAL
  Filled 2017-05-03 (×3): qty 2

## 2017-05-03 MED ORDER — DEXTROSE-NACL 5-0.9 % IV SOLN: INTRAVENOUS | Status: DC

## 2017-05-03 NOTE — Progress Notes (Signed)
2 Days Post-Op    [id number] pregnancy and purulent appendicitis   Subjective: She feels bloated, taking a few clears, but not much.  Says she is very sore and tender.  Incision looks OK, her abdomen is distended this AM.  Objective: Vital signs in last 24 hours: Temp:  [97.8 F (36.6 C)-98.2 F (36.8 C)] 98 F (36.7 C) (01/18 0828) Pulse Rate:  [81-96] 84 (01/18 0828) Resp:  [16-18] 16 (01/18 0828) BP: (94-107)/(50-67) 96/56 (01/18 0828) SpO2:  [100 %] 100 % (01/18 0828) Last BM Date: 05/01/17 PO not recorded 1400 urine Afebrile, VSS  WBC is still up at 18.6 K H/H 6.5/18.7 Platelets 189K    Intake/Output from previous day: 01/17 0701 - 01/18 0700 In: -  Out: 1400 [Urine:1400] Intake/Output this shift: Total I/O In: 120 [P.O.:120] Out: 600 [Urine:600]  General appearance: alert, cooperative, no distress and uncomfortable, with surgery and distension. Resp: clear to auscultation bilaterally GI: abdomen is distended, No BS, no Flatus, no BM  Lab Results:  Recent Labs    05/02/17 0544 05/03/17 0539  WBC 24.8* 18.6*  HGB 7.3* 6.5*  HCT 20.5* 18.7*  PLT 205 189    BMET Recent Labs    05/01/17 0845  NA 131*  K 3.9  CL 102  CO2 19*  GLUCOSE 85  BUN 6  CREATININE 0.38*  CALCIUM 8.8*   PT/INR No results for input(s): LABPROT, INR in the last 72 hours.  Recent Labs  Lab 05/01/17 0845  AST 21  ALT 15  ALKPHOS 160*  BILITOT 1.4*  PROT 7.2  ALBUMIN 3.6     Lipase     Component Value Date/Time   LIPASE 21 05/01/2017 0845     Medications: . amoxicillin-clavulanate  1 tablet Oral BID  . ibuprofen  800 mg Oral Q8H  . pantoprazole  40 mg Oral Daily  . prenatal multivitamin  1 tablet Oral Q1200  . scopolamine  1 patch Transdermal Once  . senna-docusate  2 tablet Oral Q24H  . simethicone  80 mg Oral TID PC  . simethicone  80 mg Oral Q24H  . Tdap  0.5 mL Intramuscular Once   . sodium chloride    . lactated ringers    . naLOXone [facility])  adult infusion for PRURITIS     Anti-infectives (From admission, onward)   Start     Dose/Rate Route Frequency Ordered Stop   05/03/17 1000  amoxicillin-clavulanate (AUGMENTIN) 875-125 MG per tablet 1 tablet     1 tablet Oral 2 times daily 05/03/17 0904     05/02/17 0600  piperacillin-tazobactam (ZOSYN) IVPB 3.375 g  Status:  Discontinued     3.375 g 12.5 mL/hr over 240 Minutes Intravenous Every 8 hours 05/02/17 0108 05/03/17 0904   05/02/17 0115  piperacillin-tazobactam (ZOSYN) IVPB 3.375 g  Status:  Discontinued     3.375 g 100 mL/hr over 30 Minutes Intravenous Every 8 hours 05/02/17 0103 05/02/17 0106   05/01/17 2230  piperacillin-tazobactam (ZOSYN) IVPB 3.375 g     3.375 g 100 mL/hr over 30 Minutes Intravenous  Once 05/01/17 2219 05/01/17 2235   05/01/17 2215  piperacillin-tazobactam (ZOSYN) IVPB 3.375 g  Status:  Discontinued     3.375 g 100 mL/hr over 30 Minutes Intravenous  Once 05/01/17 2207 05/01/17 2219   05/01/17 2000  penicillin G potassium 3 Million Units in dextrose 50mL IVPB  Status:  Discontinued     3 Million Units 100 mL/hr over 30 Minutes Intravenous Every 4 hours  05/01/17 1546 05/02/17 0033   05/01/17 1600  penicillin G potassium 5 Million Units in dextrose 5 % 250 mL IVPB     5 Million Units 250 mL/hr over 60 Minutes Intravenous  Once 05/01/17 1546 05/01/17 1707     . dextrose 5 % and 0.9% NaCl    . lactated ringers    . naLOXone [facility]) adult infusion for PRURITIS    . piperacillin-tazobactam (ZOSYN)  IV       Assessment/Plan GERD  Preterm delivery at ~34 weeks (05/01/17)  Abnormal uterine tone,Purulent appendicitis S/p CESAREAN SECTION and APPENDECTOMY 1/16 Dr. Maisie Fushomas and Dr. Hinton RaoBovard-Stuckert - POD 2 - still having allot of pain, feels bloated, not taking much, just clear liquids,  No flatus, NO BS,   - post op ileus  -  Anemia - being transfused   ID - zosyn 1/16 - day 3   needs 7 days antibiotics total FEN - IVF, regular diet -but just taking  broth VTE - SCDs Foley - none Follow up - Dr. Maisie Fushomas   Plan:  Discussed with Dr. Mindi SlickerBanga and we will keep her on IV Zosyn, I told her to continue just liquids for now until she is passing flatus.  I will continue LR IV fluids for another 48 hours  She is getting blood today for her anemia.  We will continue to follow. Recheck labs in AM.      LOS: 2 days    Laura Krause 05/03/2017 [phone]

## 2017-05-03 NOTE — Progress Notes (Addendum)
Patient ID: Fredda HammedJelesha Lias, female   DOB: Aug 04, 1990, 27 y.o.   MRN: [id number] Pt doing well. Feels better today than yesterday. Pumping. Denies fever or chills. Pain controlled with meds. Ambulating with no dizziness. Voided well this am. Baby off O2 now; still in NICU VS 94-106/50-56 ABD - soft, ND EXT - no homans   18.6>6.5<189  A/P: POD#2 - anemia         Switch from zosyn to augmentin per general surgery ( rec x 7 days)          Start transfusion - pt was due for iron infusion next week prior to being delivered                    Routine post op/postpartum care

## 2017-05-03 NOTE — Lactation Note (Signed)
This note was copied from a baby's chart. Lactation Consultation Note  Patient Name: Laura Krause WUJWJ'XToday's Date: 05/03/2017 Reason for consult: Initial assessment;Late-preterm 34-36.6wks;NICU baby Breastfeeding consultation services and Providing Breastmilk For Your Baby in NICU book given to patient.  Patient reports pumping every 3 hours and obtaining small amounts of colostrum.  Discussed milk coming to volume and answered questions.  Mom received [employer] symphony pump today for use after discharge.  Instructed to pump 8-12 times in 24 hours.  Encouraged to call for assist/concerns prn.  Maternal Data    Feeding Feeding Type: Donor Breast Milk Length of feed: 30 min  LATCH Score                   Interventions    Lactation Tools Discussed/Used WIC Program: Yes Pump Review: Setup, frequency, and cleaning;Milk Storage Initiated by:: RN Date initiated:: 05/02/17   Consult Status Consult Status: Follow-up Date: 05/04/17 Follow-up type: In-patient    Huston FoleyMOULDEN, Candas Deemer S 05/03/2017, 12:02 PM

## 2017-05-03 NOTE — Progress Notes (Signed)
Patient ID: Laura Krause, female   DOB: 26-Dec-1990, 27 y.o.   MRN: [id number] Late entry Pt in shower. Per family member in room and nurse pt feeling better after transfusion No complaints  Plan: Continue current plan per Gen Surg          Routine post op /pp care          Await results of cbc

## 2017-05-04 LAB — TYPE AND SCREEN
ABO/RH(D): O POS
ANTIBODY SCREEN: NEGATIVE
UNIT DIVISION: 0
Unit division: 0

## 2017-05-04 LAB — BPAM RBC
BLOOD PRODUCT EXPIRATION DATE: 201902092359
Blood Product Expiration Date: 201901302359
ISSUE DATE / TIME: 201901181209
ISSUE DATE / TIME: 201901181616
UNIT TYPE AND RH: 5100
Unit Type and Rh: 5100

## 2017-05-04 LAB — BASIC METABOLIC PANEL
ANION GAP: 7 (ref 5–15)
BUN: 6 mg/dL (ref 6–20)
CHLORIDE: 108 mmol/L (ref 101–111)
CO2: 20 mmol/L — AB (ref 22–32)
Calcium: 7.8 mg/dL — ABNORMAL LOW (ref 8.9–10.3)
Creatinine, Ser: 0.44 mg/dL (ref 0.44–1.00)
GFR calc Af Amer: >60 mL/min (ref >60)
GFR calc non Af Amer: >60 mL/min (ref >60)
GLUCOSE: 78 mg/dL (ref 65–99)
POTASSIUM: 3.5 mmol/L (ref 3.5–5.1)
Sodium: 135 mmol/L (ref 135–145)

## 2017-05-04 LAB — CBC
HEMATOCRIT: 23.2 % — AB (ref 36.0–46.0)
Hemoglobin: 8.4 g/dL — ABNORMAL LOW (ref 12.0–15.0)
MCH: 27.6 pg (ref 26.0–34.0)
MCHC: 36.2 g/dL — ABNORMAL HIGH (ref 30.0–36.0)
MCV: 76.3 fL — AB (ref 78.0–100.0)
Platelets: 195 10*3/uL (ref 150–400)
RBC: 3.04 MIL/uL — AB (ref 3.87–5.11)
RDW: 20.8 % — ABNORMAL HIGH (ref 11.5–15.5)
WBC: 12.3 10*3/uL — AB (ref 4.0–10.5)

## 2017-05-04 MED ORDER — OXYCODONE HCL 5 MG PO TABS: 5.0000 mg | ORAL_TABLET | ORAL | 0 refills | Status: AC | PRN

## 2017-05-04 MED ORDER — AMOXICILLIN-POT CLAVULANATE ER 1000-62.5 MG PO TB12: 1.0000 | ORAL_TABLET | Freq: Two times a day (BID) | ORAL | 0 refills | Status: DC

## 2017-05-04 MED ORDER — OXYCODONE HCL 5 MG PO TABS: 5.0000 mg | ORAL_TABLET | ORAL | 0 refills | Status: DC | PRN

## 2017-05-04 MED ORDER — IBUPROFEN 800 MG PO TABS: 800.0000 mg | ORAL_TABLET | Freq: Three times a day (TID) | ORAL | 1 refills | Status: AC | PRN

## 2017-05-04 NOTE — Progress Notes (Signed)
Discharge instructions reviewed with patient.  Patient states understanding of home care, medications, activity, signs/symptoms to report to MD and return MD office visit.  Patients significant other and family will assist with her care @ home.  No home  equipment needed, patient has prescriptions and all personal belongings.  Patient discharged via wheelchair in stable condition with staff without incident.  

## 2017-05-04 NOTE — Discharge Instructions (Signed)
Nothing in vagina for 6 weeks.  No sex, tampons, and douching.  Other instructions as in [facility].   [facility] ______[facility]: POST OP INSTRUCTIONS Always review your discharge instruction sheet given to you by the facility where your surgery was performed. IF YOU HAVE DISABILITY OR FAMILY LEAVE FORMS, YOU MUST BRING THEM TO THE OFFICE FOR PROCESSING.   DO NOT GIVE THEM TO YOUR DOCTOR.  1. A prescription for pain medication may be given to you upon discharge.  Take your pain medication as prescribed, if needed.  If narcotic pain medicine is not needed, then you may take acetaminophen (Tylenol) or ibuprofen (Advil) as needed. 2. Take your usually prescribed medications unless otherwise directed. 3. If you need a refill on your pain medication, please contact your pharmacy.  They will contact our office to request authorization. Prescriptions will not be filled after 5pm or on week-ends. 4. You should follow a light diet the first few days after arrival home, such as soup and crackers, etc.  Be sure to include lots of fluids daily. 5. Most patients will experience some swelling and bruising in the area of the incisions.  Ice packs will help.  Swelling and bruising can take several days to resolve.  6. It is common to experience some constipation if taking pain medication after surgery.  Increasing fluid intake and taking a stool softener (such as Colace) will usually help or prevent this problem from occurring.  A mild laxative (Milk of Magnesia or Miralax) should be taken according to package instructions if there are no bowel movements after 48 hours. 7. Unless discharge instructions indicate otherwise, you may remove your bandages 24-48 hours after surgery, and you may shower at that time.  You may have steri-strips (small skin tapes) in place directly over the incision.  These strips should be left on the skin for 7-10 days.  If your  surgeon used skin glue on the incision, you may shower in 24 hours.  The glue will flake off over the next 2-3 weeks.  Any sutures or staples will be removed at the office during your follow-up visit. 8. ACTIVITIES:  You may resume regular (light) daily activities beginning the next day--such as daily self-care, walking, climbing stairs--gradually increasing activities as tolerated.  You may have sexual intercourse when it is comfortable.  Refrain from any heavy lifting or straining until approved by your doctor. a. You may drive when you are no longer taking prescription pain medication, you can comfortably wear a seatbelt, and you can safely maneuver your car and apply brakes. b. RETURN TO WORK:  __________________________________________________________ 9. You should see your doctor in the office for a follow-up appointment approximately 2-3 weeks after your surgery.  Make sure that you call for this appointment within a day or two after you arrive home to insure a convenient appointment time. 10. OTHER INSTRUCTIONS: __________________________________________________________________________________________________________________________ __________________________________________________________________________________________________________________________ WHEN TO CALL YOUR DOCTOR: 1. Fever over 101.0 2. Inability to urinate 3. Continued bleeding from incision. 4. Increased pain, redness, or drainage from the incision. 5. Increasing abdominal pain  The clinic staff is available to answer your questions during regular business hours.  Please dont hesitate to call and ask to speak to one of the nurses for clinical concerns.  If you have a medical emergency, go to the nearest emergency room or call 911.  [facility] is always on call at the hospital. [address], [address],  [place], [place]  [address] ? [address] [address], [place], [place]   [address] [phone] ?  [phone] ? FAX [phone] Web site: [email or web address] 

## 2017-05-04 NOTE — Progress Notes (Signed)
Patient ID: Laura Krause, female   DOB: 19-Jan-1991, 27 y.o.   MRN: [id number] POD# 3 Pt doing well. Sitting up in bed. Thinks may have mild emgorgement in left breast- no fever or chills. She tolerated dinner last night ( chicken and broc/carrots); passing some flatus today but still feels slightly "bloated" . Lochia mild. No CP or SOB. Improved energy after transfusion; some mild LE swelling. No complaints. Baby doing well off room air; working on temp regulation VSS 98-102/59-73 ABD - moderately distended, +BS; incision c/d/i EXT - mild edema only; no homans  12.3>8.4<195; 135/3.5/108/20/6/0.44<78  A/P: POD#3 s/p c/s and appendectomy         WBC trending downwards - on zosyn         Hg improved after 2u prbc transfused         Rec warm compresses to breast and continued pumping to relieve engorgement          Will confirm plan for continued antibx care with gen surg: ? Switch to augmentin today?           Routine post op care at this time

## 2017-05-04 NOTE — Lactation Note (Signed)
This note was copied from a baby's chart. Lactation Consultation Note  Patient Name: Girl Fredda HammedJelesha Roemer GMWNU'UToday's Date: 05/04/2017 Reason for consult: Follow-up assessment;NICU baby Mom's breasts are full this AM, slight engorgement.  Stressed importance of using ice packs, pumping with hands on pumping every 2 hours and taking ibuprofen as directed.  Assisted with positioning baby in football hold on right side.  Baby latched after a few attempts but slips off and needs to be relatched often.  Instructed to use good breast massage during feeding.  Discussed with mom that breastfeeding will improve as baby reaches term.  Maternal Data    Feeding Feeding Type: Breast Fed Nipple Type: Regular Length of feed: 30 min  LATCH Score Latch: Repeated attempts needed to sustain latch, nipple held in mouth throughout feeding, stimulation needed to elicit sucking reflex.  Audible Swallowing: A few with stimulation  Type of Nipple: Everted at rest and after stimulation  Comfort (Breast/Nipple): Filling, red/small blisters or bruises, mild/mod discomfort  Hold (Positioning): Assistance needed to correctly position infant at breast and maintain latch.  LATCH Score: 6  Interventions Interventions: Assisted with latch;Breast compression;Skin to skin;Adjust position;Breast massage;Support pillows;Hand express  Lactation Tools Discussed/Used     Consult Status Consult Status: PRN    Huston FoleyMOULDEN, Nakita Santerre S 05/04/2017, 12:28 PM

## 2017-05-04 NOTE — Discharge Summary (Signed)
OB Discharge Summary     Patient Name: Laura Krause DOB: 1990/08/02 MRN: [id number]  Date of admission: 05/01/2017 Delivering MD: Sherian ReinBOVARD-STUCKERT, JODY   Date of discharge: 05/04/2017  Admitting diagnosis: Abdominal pain during pregnancy in third trimester [O26.893, R10.9] Intrauterine pregnancy: [id number]     Secondary diagnosis:  Principal Problem:   S/P cesarean section Active Problems:   Indication for care or intervention related to labor and delivery, antepartum   S/P appendectomy  Additional problems: appendicitis     Discharge diagnosis: Preterm Pregnancy Delivered and s/p appendiectomy                                                                                                Post partum procedures:blood transfusion  Augmentation: n/a  Complications: None  Hospital course:  Sceduled C/S   27 y.o. yo G2P0111 at 3976w3d was admitted to the hospital 05/01/2017 for scheduled cesarean section with the following indication:Elective Primary due to concurrent appendicitis  Membrane Rupture Time/Date: 9:42 PM ,05/01/2017   Patient delivered a Viable infant.05/01/2017  Details of operation can be found in separate operative note.  Pateint had an uncomplicated postpartum course.  She is ambulating, tolerating a regular diet, passing flatus, and urinating well. Patient is discharged home in stable condition on  05/04/17         Physical exam  Vitals:   05/03/17 1918 05/03/17 2000 05/04/17 0233 05/04/17 0911  BP: 102/60 101/66 99/73 (!) 98/59  Pulse: 87 86 89 88  Resp: 18 18 18 18   Temp: 100 F (37.8 C) 98.6 F (37 C)  98.2 F (36.8 C)  TempSrc:  Oral  Oral  SpO2: 100% 100% 100% 100%  Weight:      Height:       General: alert, cooperative and no distress Lochia: appropriate Uterine Fundus: firm Incision: Dressing is clean, dry, and intact DVT Evaluation: No evidence of DVT seen on physical exam. Labs: Lab Results  Component Value Date   WBC 12.3 (H) 05/04/2017   HGB 8.4 (L) 05/04/2017   HCT 23.2 (L) 05/04/2017   MCV 76.3 (L) 05/04/2017   PLT 195 05/04/2017   CMP Latest Ref Rng & Units 05/04/2017  Glucose 65 - 99 mg/dL 78  BUN 6 - 20 mg/dL 6  Creatinine 6.430.44 - 3.291.00 mg/dL 5.180.44  Sodium 841135 - 660145 mmol/L 135  Potassium 3.5 - 5.1 mmol/L 3.5  Chloride 101 - 111 mmol/L 108  CO2 22 - 32 mmol/L 20(L)  Calcium 8.9 - 10.3 mg/dL 7.8(L)  Total Protein 6.5 - 8.1 g/dL -  Total Bilirubin 0.3 - 1.2 mg/dL -  Alkaline Phos 38 - 630126 U/L -  AST 15 - 41 U/L -  ALT 14 - 54 U/L -    Discharge instruction: per After Visit Summary and "Baby and Me Booklet".  After visit meds:  Allergies as of 05/04/2017   No Known Allergies     Medication List    TAKE these medications   amoxicillin-clavulanate 1000-62.5 MG 12 hr tablet Commonly known as:  AUGMENTIN XR Take 1 tablet by mouth 2 (two) times  daily.   ibuprofen 800 MG tablet Commonly known as:  ADVIL,MOTRIN Take 1 tablet (800 mg total) by mouth every 8 (eight) hours as needed.   oxyCODONE 5 MG immediate release tablet Commonly known as:  Oxy IR/ROXICODONE Take 1 tablet (5 mg total) by mouth every 4 (four) hours as needed for severe pain or breakthrough pain. What changed:    when to take this  reasons to take this   pantoprazole 40 MG tablet Commonly known as:  PROTONIX Take 40 mg by mouth daily.   polyethylene glycol packet Commonly known as:  MIRALAX / GLYCOLAX Take 17 g by mouth daily.   prenatal multivitamin Tabs tablet Take 1 tablet by mouth daily at 12 noon.       Diet: routine diet  Activity: Advance as tolerated. Pelvic rest for 6 weeks.   Outpatient follow up:2 weeks Follow up Appt: Future Appointments  Date Time Provider Department Center  06/13/2017  1:00 PM Daisy Blossom, MD [facility] None   Follow up Visit:No Follow-up on file.  Postpartum contraception: Not Discussed  Newborn Data: Live born female  Birth Weight: 4 lb 0.2 oz (1820 g) APGAR: 5, 6  Newborn  Delivery   Birth date/time:  05/01/2017 21:43:00 Delivery type:  C-Section, Vacuum Assisted C-section categorization:  Primary     Baby Feeding: Breast Disposition:NICU   05/04/2017 Cathrine Muster, DO

## 2017-05-04 NOTE — Progress Notes (Signed)
Patient ID: Laura HammedJelesha Krause, female   DOB: 22-Jan-1991, 27 y.o.   MRN: [id number] Pt desires discharge tohome today Per Gen Surgery ok for discharge.  Rx for ibuprofen, oxycodone and augment x 10 days sent F/u with OBGYn in two week for incision check and 6 weeks for postpartum visit and with Gen Surgery in two weeks for post op care

## 2017-05-08 ENCOUNTER — Encounter: Payer: Medicaid Other | Admitting: Hematology and Oncology

## 2017-05-17 ENCOUNTER — Observation Stay (HOSPITAL_COMMUNITY)
Admission: AD | Admit: 2017-05-17 | Discharge: 2017-05-19 | Disposition: A | Discharge status: Home / Self Care | Payer: Medicaid Other | Source: Ambulatory Visit | Attending: Obstetrics and Gynecology | Admitting: Obstetrics and Gynecology

## 2017-05-17 ENCOUNTER — Other Ambulatory Visit: Payer: Self-pay

## 2017-05-17 ENCOUNTER — Encounter (HOSPITAL_COMMUNITY): Payer: Self-pay | Admitting: *Deleted

## 2017-05-17 DIAGNOSIS — M791 Myalgia, unspecified site: Secondary | ICD-10-CM | POA: Insufficient documentation

## 2017-05-17 DIAGNOSIS — R509 Fever, unspecified: Secondary | ICD-10-CM | POA: Diagnosis not present

## 2017-05-17 DIAGNOSIS — D649 Anemia, unspecified: Principal | ICD-10-CM

## 2017-05-17 DIAGNOSIS — Z79899 Other long term (current) drug therapy: Secondary | ICD-10-CM | POA: Insufficient documentation

## 2017-05-17 DIAGNOSIS — M549 Dorsalgia, unspecified: Secondary | ICD-10-CM | POA: Diagnosis not present

## 2017-05-17 DIAGNOSIS — R61 Generalized hyperhidrosis: Secondary | ICD-10-CM | POA: Insufficient documentation

## 2017-05-17 DIAGNOSIS — D62 Acute posthemorrhagic anemia: Secondary | ICD-10-CM | POA: Diagnosis present

## 2017-05-17 DIAGNOSIS — R002 Palpitations: Secondary | ICD-10-CM | POA: Diagnosis not present

## 2017-05-17 DIAGNOSIS — R079 Chest pain, unspecified: Secondary | ICD-10-CM | POA: Diagnosis not present

## 2017-05-17 DIAGNOSIS — R0602 Shortness of breath: Secondary | ICD-10-CM | POA: Insufficient documentation

## 2017-05-17 DIAGNOSIS — R51 Headache: Secondary | ICD-10-CM | POA: Diagnosis present

## 2017-05-17 LAB — COMPREHENSIVE METABOLIC PANEL
ALT: 13 U/L — AB (ref 14–54)
AST: 28 U/L (ref 15–41)
Albumin: 3.2 g/dL — ABNORMAL LOW (ref 3.5–5.0)
Alkaline Phosphatase: 65 U/L (ref 38–126)
Anion gap: 8 (ref 5–15)
BILIRUBIN TOTAL: 2.3 mg/dL — AB (ref 0.3–1.2)
CO2: 25 mmol/L (ref 22–32)
CREATININE: 0.35 mg/dL — AB (ref 0.44–1.00)
Calcium: 8.2 mg/dL — ABNORMAL LOW (ref 8.9–10.3)
Chloride: 106 mmol/L (ref 101–111)
GFR calc Af Amer: >60 mL/min (ref >60)
Glucose, Bld: 82 mg/dL (ref 65–99)
Potassium: 3 mmol/L — ABNORMAL LOW (ref 3.5–5.1)
Sodium: 139 mmol/L (ref 135–145)
TOTAL PROTEIN: 6.7 g/dL (ref 6.5–8.1)

## 2017-05-17 LAB — LACTIC ACID, PLASMA
LACTIC ACID, VENOUS: 1.1 mmol/L (ref 0.5–1.9)
LACTIC ACID, VENOUS: 0.7 mmol/L (ref 0.5–1.9)

## 2017-05-17 LAB — URINALYSIS, ROUTINE W REFLEX MICROSCOPIC
BACTERIA UA: NONE SEEN
Bilirubin Urine: NEGATIVE
Glucose, UA: NEGATIVE mg/dL
Ketones, ur: NEGATIVE mg/dL
Leukocytes, UA: NEGATIVE
Nitrite: NEGATIVE
Protein, ur: 30 mg/dL — AB
Specific Gravity, Urine: 1.018 (ref 1.005–1.030)
pH: 7 (ref 5.0–8.0)

## 2017-05-17 LAB — CBC
HEMATOCRIT: 20.7 % — AB (ref 36.0–46.0)
HEMOGLOBIN: 6.8 g/dL — AB (ref 12.0–15.0)
MCH: 24.1 pg — ABNORMAL LOW (ref 26.0–34.0)
MCHC: 32.9 g/dL (ref 30.0–36.0)
MCV: 73.4 fL — AB (ref 78.0–100.0)
Platelets: 183 10*3/uL (ref 150–400)
RBC: 2.82 MIL/uL — AB (ref 3.87–5.11)
RDW: 23.4 % — ABNORMAL HIGH (ref 11.5–15.5)
WBC: 5 10*3/uL (ref 4.0–10.5)

## 2017-05-17 LAB — PREPARE RBC (CROSSMATCH)

## 2017-05-17 MED ORDER — DEXAMETHASONE SODIUM PHOSPHATE 10 MG/ML IJ SOLN
10.0000 mg | Freq: Once | INTRAMUSCULAR | Status: AC
  Administered 2017-05-17: 10 mg via INTRAVENOUS
  Filled 2017-05-17: qty 1

## 2017-05-17 MED ORDER — SODIUM CHLORIDE 0.9 % IV SOLN: Freq: Once | INTRAVENOUS | Status: DC

## 2017-05-17 MED ORDER — METOCLOPRAMIDE HCL 5 MG/ML IJ SOLN
10.0000 mg | Freq: Once | INTRAMUSCULAR | Status: AC
  Administered 2017-05-17: 10 mg via INTRAVENOUS
  Filled 2017-05-17: qty 2

## 2017-05-17 MED ORDER — DIPHENHYDRAMINE HCL 50 MG/ML IJ SOLN
25.0000 mg | Freq: Once | INTRAMUSCULAR | Status: AC
  Administered 2017-05-17: 25 mg via INTRAVENOUS
  Filled 2017-05-17: qty 1

## 2017-05-17 MED ORDER — IBUPROFEN 600 MG PO TABS
600.0000 mg | ORAL_TABLET | Freq: Four times a day (QID) | ORAL | Status: DC | PRN
  Administered 2017-05-17 – 2017-05-19 (×4): 600 mg via ORAL
  Filled 2017-05-17 (×4): qty 1

## 2017-05-17 MED ORDER — SODIUM CHLORIDE 0.9 % IV BOLUS (SEPSIS)
1000.0000 mL | Freq: Once | INTRAVENOUS | Status: AC
  Administered 2017-05-17: 1000 mL via INTRAVENOUS

## 2017-05-17 NOTE — MAU Provider Note (Signed)
Chief Complaint: Headache; Back Pain; and Fever   First Provider Initiated Contact with Patient 05/17/17 1942      SUBJECTIVE HPI: Laura Krause is a 27 y.o. G2P0111 on POD# 16 following PLTCS and appendectomy with peritonitis from acute appendicitis managed by general surgery who presents to maternity admissions reporting body aches, daily chills, h/a, back pain, shortness of breath, and palpitations.  She reports the body aches are generalized and intermittent. She wakes up every morning and is sweaty and has chills but they come and go during the day. She has headaches that she feels are tension h/a because they start in her neck and radiate up into the back of her head. These occur daily. They improve with ibuprofen or prescribed pain medication but do not completely go away. She took ibuprofen today with no headache relief.  Her back pain is in her mid/upper back and she is concerned about her kidneys. The pain occurs more when she is lifting her baby or the carseat or when sitting leaning forward to use the breastpump.  She reports that shortness of breath is also intermittent, occurring with activity or when lying down.  She feels chest discomfort or palpitations only when lying on her side in bed.  She denies any shortness of breath or palpitations now but does have severe h/a and pain in her upper back. She reports a fever of 100.7 last night which resolved with ibuprofen but none today.  There are no other associated symptoms.  She has not tried any other treatments.  She denies abdominal pain and reports light/scant lochia.      HPI  Past Medical History:  Diagnosis Date  . Daily headache   . Depression   . Gallstones   . GERD (gastroesophageal reflux disease)   . S/P appendectomy 05/01/2017  . S/P cesarean section 05/01/2017  . Sickle cell trait (HCC)   . UTI (lower urinary tract infection) ~ 02/2014   "not recurrent"   Past Surgical History:  Procedure Laterality Date  .  APPENDECTOMY    . APPENDECTOMY N/A 05/01/2017   Procedure: APPENDECTOMY;  Surgeon: Sherian ReinBovard-Stuckert, Jody, MD;  Location: [facility];  Service: Obstetrics;  Laterality: N/A;  . CESAREAN SECTION    . CESAREAN SECTION N/A 05/01/2017   Procedure: CESAREAN SECTION;  Surgeon: Sherian ReinBovard-Stuckert, Jody, MD;  Location: [facility];  Service: Obstetrics;  Laterality: N/A;   Social History   Socioeconomic History  . Marital status: Single    Spouse name: Not on file  . Number of children: Not on file  . Years of education: Not on file  . Highest education level: Not on file  Social Needs  . Financial resource strain: Not on file  . Food insecurity - worry: Not on file  . Food insecurity - inability: Not on file  . Transportation needs - medical: Not on file  . Transportation needs - non-medical: Not on file  Occupational History  . Occupation: Conservation officer, natureCashier  . Occupation: Student    Comment: [employer]   Tobacco Use  . Smoking status: Never Smoker  . Smokeless tobacco: Never Used  Substance and Sexual Activity  . Alcohol use: Yes    Comment: 10/20/2014 "might have 2 glasses of wine once/nmonth"  . Drug use: No  . Sexual activity: Yes    Birth control/protection: None  Other Topics Concern  . Not on file  Social History Narrative   Interested in Nursing. Fun: Sleep, reading, movies.    Denies any religious  beliefs effecting health care.    No current facility-administered medications on file prior to encounter.    Current Outpatient Medications on File Prior to Encounter  Medication Sig Dispense Refill  . amoxicillin-clavulanate (AUGMENTIN XR) 1000-62.5 MG 12 hr tablet Take 1 tablet by mouth 2 (two) times daily. 20 tablet 0  . ibuprofen (ADVIL,MOTRIN) 800 MG tablet Take 1 tablet (800 mg total) by mouth every 8 (eight) hours as needed. 30 tablet 1  . oxyCODONE (OXY IR/ROXICODONE) 5 MG immediate release tablet Take 1 tablet (5 mg total) by mouth every 4 (four) hours as needed for severe  pain or breakthrough pain. 30 tablet 0  . pantoprazole (PROTONIX) 40 MG tablet Take 40 mg by mouth daily.    . Prenatal Vit-Fe Fumarate-FA (PRENATAL MULTIVITAMIN) TABS tablet Take 1 tablet by mouth daily at 12 noon. 100 tablet 1  . polyethylene glycol (MIRALAX / GLYCOLAX) packet Take 17 g by mouth daily. 30 each 1   No Known Allergies  ROS:  Review of Systems  Constitutional: Positive for chills, diaphoresis, fatigue and fever.  Respiratory: Positive for chest tightness. Negative for shortness of breath.   Cardiovascular: Positive for palpitations. Negative for chest pain.  Gastrointestinal: Negative for abdominal pain, nausea and vomiting.  Genitourinary: Negative for difficulty urinating, dysuria, flank pain, pelvic pain, vaginal bleeding, vaginal discharge and vaginal pain.  Musculoskeletal: Positive for myalgias and neck pain.  Neurological: Positive for headaches. Negative for dizziness.  Psychiatric/Behavioral: Negative.      I have reviewed patient's Past Medical Hx, Surgical Hx, Family Hx, Social Hx, medications and allergies.   Physical Exam   Patient Vitals for the past 24 hrs:  BP Temp Temp src Pulse Resp SpO2  05/17/17 1730 133/81 98.5 F (36.9 C) Oral 85 18 99 %  05/17/17 1728 - - - - - 99 %   Constitutional: Well-developed, well-nourished female in no acute distress.  HEART: normal rate, heart sounds, regular rhythm RESP: normal effort, lung sounds clear and equal bilaterally GI: Abd soft, non-tender. Pos BS x 4 MS: Extremities nontender, no edema, normal ROM Neurologic: Alert and oriented x 4.  GU: Neg CVAT.  PELVIC EXAM: Deferred  LAB RESULTS Results for orders placed or performed during the hospital encounter of 05/17/17 (from the past 24 hour(s))  Urinalysis, Routine w reflex microscopic     Status: Abnormal   Collection Time: 05/17/17  5:33 PM  Result Value Ref Range   Color, Urine AMBER (A) YELLOW   APPearance CLEAR CLEAR   Specific Gravity, Urine  1.018 1.005 - 1.030   pH 7.0 5.0 - 8.0   Glucose, UA NEGATIVE NEGATIVE mg/dL   Hgb urine dipstick MODERATE (A) NEGATIVE   Bilirubin Urine NEGATIVE NEGATIVE   Ketones, ur NEGATIVE NEGATIVE mg/dL   Protein, ur 30 (A) NEGATIVE mg/dL   Nitrite NEGATIVE NEGATIVE   Leukocytes, UA NEGATIVE NEGATIVE   RBC / HPF 0-5 0 - 5 RBC/hpf   WBC, UA 0-5 0 - 5 WBC/hpf   Bacteria, UA NONE SEEN NONE SEEN   Squamous Epithelial / LPF 0-5 (A) NONE SEEN   Mucus PRESENT   CBC     Status: Abnormal   Collection Time: 05/17/17  6:31 PM  Result Value Ref Range   WBC 5.0 4.0 - 10.5 K/uL   RBC 2.82 (L) 3.87 - 5.11 MIL/uL   Hemoglobin 6.8 (LL) 12.0 - 15.0 g/dL   HCT 98.1 (L) 19.1 - 47.8 %   MCV 73.4 (L) 78.0 - 100.0  fL   MCH 24.1 (L) 26.0 - 34.0 pg   MCHC 32.9 30.0 - 36.0 g/dL   RDW 16.1 (H) 09.6 - 04.5 %   Platelets 183 150 - 400 K/uL  Comprehensive metabolic panel     Status: Abnormal   Collection Time: 05/17/17  6:31 PM  Result Value Ref Range   Sodium 139 135 - 145 mmol/L   Potassium 3.0 (L) 3.5 - 5.1 mmol/L   Chloride 106 101 - 111 mmol/L   CO2 25 22 - 32 mmol/L   Glucose, Bld 82 65 - 99 mg/dL   BUN <5 (L) 6 - 20 mg/dL   Creatinine, Ser 4.09 (L) 0.44 - 1.00 mg/dL   Calcium 8.2 (L) 8.9 - 10.3 mg/dL   Total Protein 6.7 6.5 - 8.1 g/dL   Albumin 3.2 (L) 3.5 - 5.0 g/dL   AST 28 15 - 41 U/L   ALT 13 (L) 14 - 54 U/L   Alkaline Phosphatase 65 38 - 126 U/L   Total Bilirubin 2.3 (H) 0.3 - 1.2 mg/dL   GFR calc non Af Amer >60 >60 mL/min   GFR calc Af Amer >60 >60 mL/min   Anion gap 8 5 - 15    --/--/O POS (01/16 0845)  IMAGING   MAU Management/MDM: CBC, CMP, UA Blood cultures x 2, lactic acid Pt with symptomatic anemia with hgb 6.8, which explains most of her symptoms.  No fever or signs of infection on exam today but pt with report of fever last night. No evidence of UTI, mastitis, or abdominal infection. Consult Dr Jackelyn Knife with assessment and findings.  Admit for blood transfusion to treat  symptomatic anemia.  Continue to monitor for fever/signs of infection.  Treatments in MAU included headache cocktail (NS, Reglan 10 mg IV, Benadryl 25 mg IV, Decadron 10 mg IV).  Pt stable at time of transfer.  Pt plans to room in with newborn and her s/o will stay in the room overnight.  ASSESSMENT 1. Symptomatic anemia     PLAN Admit to [facility]OB Unit  Administer PRBCs x 2 units Monitor for fever/signs of infection    Sharen Counter Certified Nurse-Midwife 05/17/2017  8:45 PM

## 2017-05-17 NOTE — MAU Note (Addendum)
Burning dull pain where her kidneys are (neg CVA tenderness).   Is on antibiotics for the surgery she had (c/s appy 1/16). Been having really really bad tension headaches(though denies currently).  Has a hx of these headaches, but they are bad. Feels like whole body is throbbing. Swelling at times.  Last night had a fever of 100.7, didn't check it today. Throws up at times.

## 2017-05-18 ENCOUNTER — Observation Stay (HOSPITAL_COMMUNITY): Payer: Medicaid Other

## 2017-05-18 LAB — INFLUENZA PANEL BY PCR (TYPE A & B)
Influenza A By PCR: NEGATIVE
Influenza B By PCR: NEGATIVE

## 2017-05-18 MED ORDER — IOPAMIDOL (ISOVUE-300) INJECTION 61%
100.0000 mL | Freq: Once | INTRAVENOUS | Status: AC | PRN
  Administered 2017-05-18: 100 mL via INTRAVENOUS

## 2017-05-18 MED ORDER — NIFEDIPINE ER OSMOTIC RELEASE 30 MG PO TB24
30.0000 mg | ORAL_TABLET | Freq: Every day | ORAL | Status: DC
  Administered 2017-05-18 – 2017-05-19 (×2): 30 mg via ORAL
  Filled 2017-05-18 (×3): qty 1

## 2017-05-18 NOTE — Progress Notes (Signed)
Patient friend taking care of baby in patient room while patient is in CT.

## 2017-05-18 NOTE — Progress Notes (Signed)
Flu swab is neg Will proceed with CT scan

## 2017-05-18 NOTE — H&P (Signed)
HPI: Laura Krause is a 27 y.o. [phone] on POD# 16 following PLTCS and appendectomy with peritonitis from acute appendicitis managed by general surgery who presents to maternity admissions reporting body aches, daily chills, h/a, back pain, shortness of breath, and palpitations.  She reports the body aches are generalized and intermittent. She wakes up every morning and is sweaty and has chills but they come and go during the day. She has headaches that she feels are tension h/a because they start in her neck and radiate up into the back of her head. These occur daily. They improve with ibuprofen or prescribed pain medication but do not completely go away. She took ibuprofen today with no headache relief.  Her back pain is in her mid/upper back and she is concerned about her kidneys. The pain occurs more when she is lifting her baby or the carseat or when sitting leaning forward to use the breastpump.  She reports that shortness of breath is also intermittent, occurring with activity or when lying down.  She feels chest discomfort or palpitations only when lying on her side in bed.  She denies any shortness of breath or palpitations now but does have severe h/a and pain in her upper back. She reports a fever of 100.7 last night which resolved with ibuprofen but none today.  There are no other associated symptoms.  She has not tried any other treatments.  She denies abdominal pain and reports light/scant lochia.      HPI      Past Medical History:  Diagnosis Date  . Daily headache   . Depression   . Gallstones   . GERD (gastroesophageal reflux disease)   . S/P appendectomy 05/01/2017  . S/P cesarean section 05/01/2017  . Sickle cell trait (HCC)   . UTI (lower urinary tract infection) ~ 02/2014   "not recurrent"        Past Surgical History:  Procedure Laterality Date  . APPENDECTOMY    . APPENDECTOMY N/A 05/01/2017   Procedure: APPENDECTOMY;  Surgeon: Sherian ReinBovard-Stuckert, Jody, MD;   Location: [facility];  Service: Obstetrics;  Laterality: N/A;  . CESAREAN SECTION    . CESAREAN SECTION N/A 05/01/2017   Procedure: CESAREAN SECTION;  Surgeon: Sherian ReinBovard-Stuckert, Jody, MD;  Location: [facility];  Service: Obstetrics;  Laterality: N/A;   Social History        Socioeconomic History  . Marital status: Single    Spouse name: Not on file  . Number of children: Not on file  . Years of education: Not on file  . Highest education level: Not on file  Social Needs  . Financial resource strain: Not on file  . Food insecurity - worry: Not on file  . Food insecurity - inability: Not on file  . Transportation needs - medical: Not on file  . Transportation needs - non-medical: Not on file  Occupational History  . Occupation: Conservation officer, natureCashier  . Occupation: Student    Comment: [employer]   Tobacco Use  . Smoking status: Never Smoker  . Smokeless tobacco: Never Used  Substance and Sexual Activity  . Alcohol use: Yes    Comment: 10/20/2014 "might have 2 glasses of wine once/nmonth"  . Drug use: No  . Sexual activity: Yes    Birth control/protection: None  Other Topics Concern  . Not on file  Social History Narrative   Interested in Nursing. Fun: Sleep, reading, movies.    Denies any religious beliefs effecting health care.    No current  facility-administered medications on file prior to encounter.          Current Outpatient Medications on File Prior to Encounter  Medication Sig Dispense Refill  . amoxicillin-clavulanate (AUGMENTIN XR) 1000-62.5 MG 12 hr tablet Take 1 tablet by mouth 2 (two) times daily. 20 tablet 0  . ibuprofen (ADVIL,MOTRIN) 800 MG tablet Take 1 tablet (800 mg total) by mouth every 8 (eight) hours as needed. 30 tablet 1  . oxyCODONE (OXY IR/ROXICODONE) 5 MG immediate release tablet Take 1 tablet (5 mg total) by mouth every 4 (four) hours as needed for severe pain or breakthrough pain. 30 tablet 0  . pantoprazole (PROTONIX) 40 MG tablet  Take 40 mg by mouth daily.    . Prenatal Vit-Fe Fumarate-FA (PRENATAL MULTIVITAMIN) TABS tablet Take 1 tablet by mouth daily at 12 noon. 100 tablet 1  . polyethylene glycol (MIRALAX / GLYCOLAX) packet Take 17 g by mouth daily. 30 each 1   No Known Allergies  ROS:  Review of Systems  Constitutional: Positive for chills, diaphoresis, fatigue and fever.  Respiratory: Positive for chest tightness. Negative for shortness of breath.   Cardiovascular: Positive for palpitations. Negative for chest pain.  Gastrointestinal: Negative for abdominal pain, nausea and vomiting.  Genitourinary: Negative for difficulty urinating, dysuria, flank pain, pelvic pain, vaginal bleeding, vaginal discharge and vaginal pain.  Musculoskeletal: Positive for myalgias and neck pain.  Neurological: Positive for headaches. Negative for dizziness.  Psychiatric/Behavioral: Negative.      I have reviewed patient's Past Medical Hx, Surgical Hx, Family Hx, Social Hx, medications and allergies.   Physical Exam   Constitutional: Well-developed, well-nourished female in no acute distress.  HEART: normal rate, heart sounds, regular rhythm RESP: normal effort, lung sounds clear and equal bilaterally GI: Abd soft, non-tender. Pos BS x 4, incision healing well, no erythema or induration MS: Extremities nontender, no edema, normal ROM Neurologic: Alert and oriented x 4.  GU: Neg CVAT.  PELVIC EXAM: Deferred    A/P  Patient admitted last pm, 14 days s/p LTCS and appendectomy with ruptured appendix.  She had post-op anemia after c-section and received 2 units PRBC.  Clinical picture on admission most c/w symptomatic anemia and admitted for transfusion.  However, she has antibodies and has yet to receive her blood, we are told it should be ready in the next 1-2 hours.  Also, temp to 102.1 last night, has been having night sweats and chills, no ill contacts-has been at home since delivery.  No evidence of wound  infection on exam, but is at risk for pelvic abscess since had ruptured appendicitis.  Flu swab is pending, if that is negative will do CT scan of abdomen and pelvis to look for abscess or septic pelvic thrombophlebitis and then decide if needs antibiotics and/or anticoagulation.

## 2017-05-19 LAB — CBC
HEMATOCRIT: 27.1 % — AB (ref 36.0–46.0)
Hemoglobin: 9.1 g/dL — ABNORMAL LOW (ref 12.0–15.0)
MCH: 25.5 pg — ABNORMAL LOW (ref 26.0–34.0)
MCHC: 33.6 g/dL (ref 30.0–36.0)
MCV: 75.9 fL — AB (ref 78.0–100.0)
PLATELETS: 150 10*3/uL (ref 150–400)
RBC: 3.57 MIL/uL — ABNORMAL LOW (ref 3.87–5.11)
RDW: 21.8 % — AB (ref 11.5–15.5)
WBC: 7.1 10*3/uL (ref 4.0–10.5)

## 2017-05-19 LAB — BPAM RBC
BLOOD PRODUCT EXPIRATION DATE: 201903062359
BLOOD PRODUCT EXPIRATION DATE: 201903082359
ISSUE DATE / TIME: 201902021832
ISSUE DATE / TIME: 201902021523
UNIT TYPE AND RH: 5100
Unit Type and Rh: 5100

## 2017-05-19 LAB — TYPE AND SCREEN
ABO/RH(D): O POS
ANTIBODY SCREEN: POSITIVE
DONOR AG TYPE: NEGATIVE
DONOR AG TYPE: NEGATIVE
PT AG TYPE: NEGATIVE
UNIT DIVISION: 0
Unit division: 0

## 2017-05-19 LAB — COMPREHENSIVE METABOLIC PANEL
ALT: 16 U/L (ref 14–54)
ANION GAP: 10 (ref 5–15)
AST: 41 U/L (ref 15–41)
Albumin: 3.2 g/dL — ABNORMAL LOW (ref 3.5–5.0)
Alkaline Phosphatase: 63 U/L (ref 38–126)
BILIRUBIN TOTAL: 3.9 mg/dL — AB (ref 0.3–1.2)
BUN: 7 mg/dL (ref 6–20)
CO2: 24 mmol/L (ref 22–32)
Calcium: 8.2 mg/dL — ABNORMAL LOW (ref 8.9–10.3)
Chloride: 101 mmol/L (ref 101–111)
Creatinine, Ser: <0.3 mg/dL — ABNORMAL LOW (ref 0.44–1.00)
Glucose, Bld: 95 mg/dL (ref 65–99)
POTASSIUM: 2.8 mmol/L — AB (ref 3.5–5.1)
Sodium: 135 mmol/L (ref 135–145)
TOTAL PROTEIN: 6.5 g/dL (ref 6.5–8.1)

## 2017-05-19 MED ORDER — CYCLOBENZAPRINE HCL 5 MG PO TABS
5.0000 mg | ORAL_TABLET | Freq: Three times a day (TID) | ORAL | Status: DC | PRN
  Administered 2017-05-19: 5 mg via ORAL
  Filled 2017-05-19 (×2): qty 1

## 2017-05-19 MED ORDER — BUTALBITAL-APAP-CAFFEINE 50-325-40 MG PO TABS: 1.0000 | ORAL_TABLET | ORAL | 0 refills | Status: AC | PRN

## 2017-05-19 MED ORDER — CYCLOBENZAPRINE HCL 5 MG PO TABS: 5.0000 mg | ORAL_TABLET | Freq: Three times a day (TID) | ORAL | 0 refills | Status: AC | PRN

## 2017-05-19 MED ORDER — NIFEDIPINE ER 30 MG PO TB24: 30.0000 mg | ORAL_TABLET | Freq: Every day | ORAL | 1 refills | Status: AC

## 2017-05-19 MED ORDER — POTASSIUM CHLORIDE CRYS ER 20 MEQ PO TBCR: 20.0000 meq | EXTENDED_RELEASE_TABLET | Freq: Three times a day (TID) | ORAL | 1 refills | Status: AC

## 2017-05-19 MED ORDER — POTASSIUM CHLORIDE CRYS ER 20 MEQ PO TBCR
20.0000 meq | EXTENDED_RELEASE_TABLET | Freq: Three times a day (TID) | ORAL | Status: DC
  Administered 2017-05-19: 20 meq via ORAL
  Filled 2017-05-19 (×4): qty 1

## 2017-05-19 MED ORDER — BUTALBITAL-APAP-CAFFEINE 50-325-40 MG PO TABS
1.0000 | ORAL_TABLET | ORAL | Status: DC | PRN
  Administered 2017-05-19: 1 via ORAL
  Filled 2017-05-19: qty 1

## 2017-05-19 NOTE — Progress Notes (Signed)
HD #2, POD #15 LTCS/appy, anemia Finally got 2 u PRBC yesterday pm, feeling better except still has headache Afeb 30+ hrs, VSS, BP normal this morning Abd- soft, NT CBC    Component Value Date/Time   WBC 7.1 05/19/2017 0519   RBC 3.57 (L) 05/19/2017 0519   HGB 9.1 (L) 05/19/2017 0519   HCT 27.1 (L) 05/19/2017 0519   PLT 150 05/19/2017 0519   MCV 75.9 (L) 05/19/2017 0519   MCH 25.5 (L) 05/19/2017 0519   MCHC 33.6 05/19/2017 0519   RDW 21.8 (H) 05/19/2017 0519   LYMPHSABS 1.4 05/03/2017 0539   MONOABS 1.0 05/03/2017 0539   EOSABS 0.0 05/03/2017 0539   BASOSABS 0.0 05/03/2017 0539   CMP Latest Ref Rng & Units 05/19/2017 05/17/2017 05/04/2017  Glucose 65 - 99 mg/dL 95 82 78  BUN 6 - 20 mg/dL 7 <8(G<5(L) 6  Creatinine 9.560.44 - 1.00 mg/dL <2.13(Y<0.30(L) 8.65(H0.35(L) 8.460.44  Sodium 135 - 145 mmol/L 135 139 135  Potassium 3.5 - 5.1 mmol/L 2.8(L) 3.0(L) 3.5  Chloride 101 - 111 mmol/L 101 106 108  CO2 22 - 32 mmol/L 24 25 20(L)  Calcium 8.9 - 10.3 mg/dL 8.2(L) 8.2(L) 7.8(L)  Total Protein 6.5 - 8.1 g/dL 6.5 6.7 -  Total Bilirubin 0.3 - 1.2 mg/dL 3.9(H) 2.3(H) -  Alkaline Phos 38 - 126 U/L 63 65 -  AST 15 - 41 U/L 41 28 -  ALT 14 - 54 U/L 16 13(L) -  CT scan yesterday no mass or abscess  A/P Anemia improved after transfusion, BP improved with PO Procardia, afebrile for 30+ hours, no evidence of infection on exam.  Still has headache.  Was slightly hypokalemic on admission at 3.0, I was hoping blood transfusion would help that some but K+ is 2.8 this morning.  Will encourage ambulation, try Fioricet and flexeril for headache, add PO K+, continue Procardia for HTN.  Will see if she feels well enough this pm to go home.

## 2017-05-19 NOTE — Discharge Instructions (Signed)
Call for increasing headache or temp >100 I will send Rx for Fioricet to her pharmacy from my office EMR

## 2017-05-20 NOTE — Discharge Summary (Signed)
Physician Discharge Summary  Patient ID: Laura Krause MRN: [id number] DOB/AGE: 1991-01-13 26 y.o.  Admit date: 05/17/2017 Discharge date: 05/19/2017  Admission Diagnoses:  Symptomatic postpartum anemia/acute blood loss anemia  Discharge Diagnoses:  Symptomatic postpartum anemia/acute blood loss anemia, hypokalemia, febrile morbidity, hypertension  Active Problems:   Acute blood loss anemia   Discharged Condition: good  Hospital Course: Pt evaluated in MAU with SOB, headache.  She was found to be anemic with Hgb 6.8, nl WBC, symptoms thought to be due to anemia.  She was also mildly hypokalemic with K+ of 3.0.  She was admitted for transfusion, however it took over 12 hours for her to start getting 2u PRBC due to antibodies.  While waiting for transfusion she was found to be persistently hypertensive so was started on Procardia XL 30 mg with improvement.  On the night of admission she also had temp to 102.1, no source on exam.  She had normal lactic acid x 2 on admission, blood cultures pending.  No antibiotics were started since no specific source identified.  Due to her h/o appendicitis at the time of her c-section, CT scan of abdomen and pelvis was performed on 2-2 which was normal, no abscess.  She was kept overnight on 2-2 since she did not get her blood until late in the afternoon .  On the morning of 2-3 her Hgb was improved to 9.1, WBC still normal, K+ down to 2.8 so started on PO K+.  She remained afebrile after the one temp of 102.  She still had a headache, so she was given Fioricet and flexeril, headache improved but did not resolve.  On the afternoon of 2-3, she was afebrile for 36+ hours, felt to be stable for discharge home.   Discharge Exam: Blood pressure (!) 154/88, pulse 74, temperature 98.7 F (37.1 C), temperature source Oral, resp. rate 18, SpO2 98 %, currently breastfeeding. General appearance: alert GI: soft, non-tencer, incision healing well  Disposition: 01-Home or  Self Care  Discharge Instructions    Diet - low sodium heart healthy   Complete by:  As directed      Allergies as of 05/19/2017   No Known Allergies     Medication List    STOP taking these medications   amoxicillin-clavulanate 1000-62.5 MG 12 hr tablet Commonly known as:  AUGMENTIN XR     TAKE these medications   butalbital-acetaminophen-caffeine 50-325-40 MG tablet Commonly known as:  FIORICET, ESGIC Take 1 tablet by mouth every 4 (four) hours as needed for headache.   cyclobenzaprine 5 MG tablet Commonly known as:  FLEXERIL Take 1 tablet (5 mg total) by mouth 3 (three) times daily as needed for muscle spasms (or neck pain/headache).   docusate sodium 100 MG capsule Commonly known as:  COLACE Take 100 mg by mouth daily as needed for mild constipation.   ibuprofen 800 MG tablet Commonly known as:  ADVIL,MOTRIN Take 1 tablet (800 mg total) by mouth every 8 (eight) hours as needed.   NIFEdipine 30 MG 24 hr tablet Commonly known as:  PROCARDIA-XL/ADALAT CC Take 1 tablet (30 mg total) by mouth daily.   oxyCODONE 5 MG immediate release tablet Commonly known as:  Oxy IR/ROXICODONE Take 1 tablet (5 mg total) by mouth every 4 (four) hours as needed for severe pain or breakthrough pain.   pantoprazole 40 MG tablet Commonly known as:  PROTONIX Take 40 mg by mouth daily.   polyethylene glycol packet Commonly known as:  MIRALAX / GLYCOLAX Take  17 g by mouth daily.   potassium chloride SA 20 MEQ tablet Commonly known as:  K-DUR,KLOR-CON Take 1 tablet (20 mEq total) by mouth 3 (three) times daily.   prenatal multivitamin Tabs tablet Take 1 tablet by mouth daily at 12 noon.      Follow-up Information    Bovard-Stuckert, Jody, MD. Schedule an appointment as soon as possible for a visit in 3 day(s).   Specialty:  Obstetrics and Gynecology Contact information: [address] [place] [address] [phone]           Signed: Leighton Roach  Swayze Pries 05/20/2017, 7:38 AM

## 2017-05-22 LAB — CULTURE, BLOOD (ROUTINE X 2)
CULTURE: NO GROWTH
CULTURE: NO GROWTH
SPECIAL REQUESTS: ADEQUATE
Special Requests: ADEQUATE

## 2017-06-13 ENCOUNTER — Encounter: Payer: Medicaid Other | Admitting: Hematology and Oncology

## 2017-06-13 ENCOUNTER — Telehealth: Payer: Self-pay

## 2017-06-13 NOTE — Telephone Encounter (Signed)
A phone call was made and a message was left in regard to patients missed appointment at 1:00.  Patient was advised to call us back to reschedule an appointment.

## 2018-05-03 ENCOUNTER — Encounter (HOSPITAL_COMMUNITY): Payer: Self-pay

## 2018-05-03 ENCOUNTER — Emergency Department (HOSPITAL_COMMUNITY)
Admission: EM | Admit: 2018-05-03 | Discharge: 2018-05-03 | Disposition: A | Discharge status: Home / Self Care | Payer: Medicaid Other | Attending: Emergency Medicine | Admitting: Emergency Medicine

## 2018-05-03 ENCOUNTER — Emergency Department (HOSPITAL_COMMUNITY): Payer: Medicaid Other

## 2018-05-03 DIAGNOSIS — R55 Syncope and collapse: Secondary | ICD-10-CM

## 2018-05-03 DIAGNOSIS — R072 Precordial pain: Secondary | ICD-10-CM

## 2018-05-03 LAB — CBC
HCT: 33 % — ABNORMAL LOW (ref 36.0–46.0)
HEMOGLOBIN: 10.7 g/dL — AB (ref 12.0–15.0)
MCH: 23 pg — ABNORMAL LOW (ref 26.0–34.0)
MCHC: 32.4 g/dL (ref 30.0–36.0)
MCV: 70.8 fL — ABNORMAL LOW (ref 80.0–100.0)
Platelets: 197 10*3/uL (ref 150–400)
RBC: 4.66 MIL/uL (ref 3.87–5.11)
RDW: 22.1 % — ABNORMAL HIGH (ref 11.5–15.5)
WBC: 5.5 10*3/uL (ref 4.0–10.5)
nRBC: 2.5 % — ABNORMAL HIGH (ref 0.0–0.2)

## 2018-05-03 LAB — HEPATIC FUNCTION PANEL
ALK PHOS: 56 U/L (ref 38–126)
ALT: 19 U/L (ref 0–44)
AST: 22 U/L (ref 15–41)
Albumin: 4.3 g/dL (ref 3.5–5.0)
BILIRUBIN INDIRECT: 0.8 mg/dL (ref 0.3–0.9)
Bilirubin, Direct: 0.2 mg/dL (ref 0.0–0.2)
TOTAL PROTEIN: 7.5 g/dL (ref 6.5–8.1)
Total Bilirubin: 1 mg/dL (ref 0.3–1.2)

## 2018-05-03 LAB — BASIC METABOLIC PANEL
Anion gap: 8 (ref 5–15)
BUN: 11 mg/dL (ref 6–20)
CO2: 25 mmol/L (ref 22–32)
Calcium: 8.8 mg/dL — ABNORMAL LOW (ref 8.9–10.3)
Chloride: 105 mmol/L (ref 98–111)
Creatinine, Ser: 0.46 mg/dL (ref 0.44–1.00)
GFR calc Af Amer: >60 mL/min (ref >60)
GFR calc non Af Amer: >60 mL/min (ref >60)
Glucose, Bld: 98 mg/dL (ref 70–99)
POTASSIUM: 3.4 mmol/L — AB (ref 3.5–5.1)
Sodium: 138 mmol/L (ref 135–145)

## 2018-05-03 LAB — URINALYSIS, ROUTINE W REFLEX MICROSCOPIC
BILIRUBIN URINE: NEGATIVE
Glucose, UA: NEGATIVE mg/dL
Hgb urine dipstick: NEGATIVE
Ketones, ur: NEGATIVE mg/dL
Leukocytes, UA: NEGATIVE
Nitrite: NEGATIVE
Protein, ur: NEGATIVE mg/dL
SPECIFIC GRAVITY, URINE: 1.033 — AB (ref 1.005–1.030)
pH: 6 (ref 5.0–8.0)

## 2018-05-03 LAB — D-DIMER, QUANTITATIVE (NOT AT ARMC): D DIMER QUANT: 0.27 ug{FEU}/mL (ref 0.00–0.50)

## 2018-05-03 LAB — HCG, SERUM, QUALITATIVE: PREG SERUM: NEGATIVE

## 2018-05-03 LAB — LIPASE, BLOOD: Lipase: 30 U/L (ref 11–51)

## 2018-05-03 LAB — CBG MONITORING, ED: Glucose-Capillary: 114 mg/dL — ABNORMAL HIGH (ref 70–99)

## 2018-05-03 LAB — I-STAT TROPONIN, ED: TROPONIN I, POC: 0 ng/mL (ref 0.00–0.08)

## 2018-05-03 MED ORDER — SODIUM CHLORIDE 0.9% FLUSH
3.0000 mL | Freq: Once | INTRAVENOUS | Status: AC
  Administered 2018-05-03: 3 mL via INTRAVENOUS

## 2018-05-03 MED ORDER — ONDANSETRON HCL 4 MG/2ML IJ SOLN
4.0000 mg | Freq: Once | INTRAMUSCULAR | Status: AC
  Administered 2018-05-03: 4 mg via INTRAVENOUS
  Filled 2018-05-03: qty 2

## 2018-05-03 MED ORDER — SODIUM CHLORIDE 0.9 % IV BOLUS
1000.0000 mL | Freq: Once | INTRAVENOUS | Status: AC
Start: 2018-05-03 — End: 2018-05-03
  Administered 2018-05-03: 1000 mL via INTRAVENOUS

## 2018-05-03 NOTE — Discharge Instructions (Signed)

## 2018-05-03 NOTE — ED Notes (Signed)
Patient transported to X-ray 

## 2018-05-03 NOTE — ED Provider Notes (Signed)
Emergency Department Provider Note   I have reviewed the triage vital signs and the nursing notes.   HISTORY  Chief Complaint Loss of Consciousness   HPI Laura Krause is a 28 y.o. female with PMH of GERD, sickle cell trait, and prior acute blood loss anemia requiring transfusion s/p c-section presents to the emergency department for evaluation of constitutional type symptoms with cough, chest pain, and reported episode of syncope.  Patient states over the past 4 evenings she has felt lightheaded, short of breath, fatigued.  She has noticed a cough which is nonproductive.  She has not experienced any fevers or shaking chills. No leg swelling or pain. She does take an iron supplement.  Patient states that she initially thought that her symptoms were because of low iron because it felt very similar to when she was very anemic after childbirth.  She denies any heavy menstrual cycles or bright red blood per rectum.  No black bowel movements.  She states that her syncopal event today was witnessed by a friend.  The friend told her that she was only out for 1 to 2 seconds.  No report of seizure-like activity.  The patient did not have chest pain, palpitations, shortness of breath prior to syncope.  Past Medical History:  Diagnosis Date  . Daily headache   . Depression   . Gallstones   . GERD (gastroesophageal reflux disease)   . S/P appendectomy 05/01/2017  . S/P cesarean section 05/01/2017  . Sickle cell trait (HCC)   . UTI (lower urinary tract infection) ~ 02/2014   "not recurrent"    Patient Active Problem List   Diagnosis Date Noted  . Acute blood loss anemia 05/17/2017  . Indication for care or intervention related to labor and delivery, antepartum 05/01/2017  . S/P cesarean section 05/01/2017  . S/P appendectomy 05/01/2017  . Abdominal pain during pregnancy in third trimester 04/20/2017  . Gastroenteritis, acute 10/20/2014  . Fever 10/20/2014  . Tachycardia 10/20/2014  .  Abdominal pain, acute 10/20/2014  . Acute hypokalemia 10/20/2014  . Leukocytosis 10/20/2014  . Scoliosis 07/12/2014    Past Surgical History:  Procedure Laterality Date  . APPENDECTOMY    . APPENDECTOMY N/A 05/01/2017   Procedure: APPENDECTOMY;  Surgeon: Sherian ReinBovard-Stuckert, Jody, MD;  Location: [facility];  Service: Obstetrics;  Laterality: N/A;  . CESAREAN SECTION    . CESAREAN SECTION N/A 05/01/2017   Procedure: CESAREAN SECTION;  Surgeon: Sherian ReinBovard-Stuckert, Jody, MD;  Location: [facility];  Service: Obstetrics;  Laterality: N/A;   Allergies Patient has no known allergies.  Family History  Problem Relation Age of Onset  . Healthy Mother   . Arthritis Maternal Grandmother   . Stroke Maternal Grandfather   . Hypertension Maternal Grandfather     Social History Social History   Tobacco Use  . Smoking status: Never Smoker  . Smokeless tobacco: Never Used  Substance Use Topics  . Alcohol use: Yes    Comment: 10/20/2014 "might have 2 glasses of wine once/nmonth"  . Drug use: No    Review of Systems  Constitutional: No fever/chills. Positive fatigue. No body aches.  Eyes: No visual changes. ENT: No sore throat. No nasal congestion.  Cardiovascular: Positive chest pain. Respiratory: Positive shortness of breath and cough.  Gastrointestinal: No abdominal pain. Positive nausea, no vomiting.  No diarrhea.  No constipation. Genitourinary: Negative for dysuria. Musculoskeletal: Negative for back pain. Skin: Negative for rash. Neurological: Negative for focal weakness or numbness. Positive HA.  10-point ROS otherwise negative.  ____________________________________________   PHYSICAL EXAM:  VITAL SIGNS: ED Triage Vitals  Enc Vitals Group     BP 05/03/18 1755 110/73     Pulse Rate 05/03/18 1755 92     Resp 05/03/18 1755 16     Temp 05/03/18 1755 99.2 F (37.3 C)     Temp Source 05/03/18 1755 Oral     SpO2 05/03/18 1755 99 %     Pain Score 05/03/18 1757 0     Constitutional: Alert and oriented. Well appearing and in no acute distress. Eyes: Conjunctivae are normal.  Head: Atraumatic. Nose: No congestion/rhinnorhea. Mouth/Throat: Mucous membranes are moist. Neck: No stridor.  Cardiovascular: Normal rate, regular rhythm. Good peripheral circulation. Grossly normal heart sounds.   Respiratory: Normal respiratory effort. No retractions. Lungs CTAB. Gastrointestinal: Soft and nontender. No distention.  Musculoskeletal: No lower extremity tenderness nor edema. No gross deformities of extremities. Neurologic:  Normal speech and language. No gross focal neurologic deficits are appreciated.  Skin:  Skin is warm, dry and intact. No rash noted.  ____________________________________________   LABS (all labs ordered are listed, but only abnormal results are displayed)  Labs Reviewed  BASIC METABOLIC PANEL - Abnormal; Notable for the following components:      Result Value   Potassium 3.4 (*)    Calcium 8.8 (*)    All other components within normal limits  CBC - Abnormal; Notable for the following components:   Hemoglobin 10.7 (*)    HCT 33.0 (*)    MCV 70.8 (*)    MCH 23.0 (*)    RDW 22.1 (*)    nRBC 2.5 (*)    All other components within normal limits  URINALYSIS, ROUTINE W REFLEX MICROSCOPIC - Abnormal; Notable for the following components:   Color, Urine AMBER (*)    APPearance HAZY (*)    Specific Gravity, Urine 1.033 (*)    All other components within normal limits  CBG MONITORING, ED - Abnormal; Notable for the following components:   Glucose-Capillary 114 (*)    All other components within normal limits  HEPATIC FUNCTION PANEL  LIPASE, BLOOD  D-DIMER, QUANTITATIVE (NOT AT [facility])  HCG, SERUM, QUALITATIVE  I-STAT BETA HCG BLOOD, ED ([facility], [facility], [facility] ONLY)  I-STAT TROPONIN, ED   ____________________________________________  EKG   EKG Interpretation  Date/Time:  Saturday May 03 2018 17:59:43 EST Ventricular Rate:  88 PR  Interval:  132 QRS Duration: 82 QT Interval:  348 QTC Calculation: 421 R Axis:   28 Text Interpretation:  Normal sinus rhythm Normal ECG No STEMI.  Confirmed by Alona BeneLong, Joshua [phone]([id number]) on 05/03/2018 6:31:38 PM       ____________________________________________  RADIOLOGY  Dg Chest 2 View  Result Date: 05/03/2018 CLINICAL DATA:  28 year old female with shortness of breath and dizziness. EXAM: CHEST - 2 VIEW COMPARISON:  Thoracic spine radiograph dated 07/12/2014 FINDINGS: The heart size and mediastinal contours are within normal limits. Both lungs are clear. The visualized skeletal structures are unremarkable. IMPRESSION: No active cardiopulmonary disease. Electronically Signed   By: Elgie CollardArash  Radparvar M.D.   On: 05/03/2018 19:49    ____________________________________________   PROCEDURES  Procedure(s) performed:   Procedures  None ____________________________________________   INITIAL IMPRESSION / ASSESSMENT AND PLAN / ED COURSE  Pertinent labs & imaging results that were available during my care of the patient were reviewed by me and considered in my medical decision making (see chart for details).  Patient presents to the emergency department with mainly  constitutional type symptoms but has experienced some episodic chest pain, shortness of breath, 1 syncope event this afternoon.  Her vital signs from triage show no tachycardia or hypotension.  She is satting 99% on room air.  Slight temp elevation to 99.2 but no fever.  Lower suspicion for flu.  Patient has had 4 days of intermittent symptoms so I do not believe she would benefit from flu testing at this time.  I have added on a d-dimer, hepatic function panel, and IV fluids.  The patient has mild anemia here but not close to requiring blood transfusion.  No history to suspect acute blood loss anemia.  Patient does have some mild nausea.  Plan for Zofran and orthostatic vital signs.  10:10 PM Patient feeling better after IV  fluids.  Possible viral infection.  Doubt flu and patient is beyond timeframe to benefit from Tamiflu.  No leukocytosis.  D-dimer is negative.  No evidence of urinary tract infection.  No vomiting here.  Plan for outpatient follow-up regarding her syncope event and chest pain.  I provided contact information for cardiology and discussed ED return precautions in detail.  ____________________________________________  FINAL CLINICAL IMPRESSION(S) / ED DIAGNOSES  Final diagnoses:  Syncope and collapse  Precordial chest pain     MEDICATIONS GIVEN DURING THIS VISIT:  Medications  sodium chloride flush (NS) 0.9 % injection 3 mL (3 mLs Intravenous Given 05/03/18 1917)  sodium chloride 0.9 % bolus 1,000 mL (0 mLs Intravenous Stopped 05/03/18 2019)  ondansetron (ZOFRAN) injection 4 mg (4 mg Intravenous Given 05/03/18 1918)    Note:  This document was prepared using Dragon voice recognition software and may include unintentional dictation errors.  Alona Bene, MD Emergency Medicine    Long, Arlyss Repress, MD 05/03/18 2222

## 2018-05-03 NOTE — ED Triage Notes (Signed)
Pt feels iron is low.   Syncope today, sensitive to temperature, shortness of breath, dizziness, chest pain.

## 2018-05-06 ENCOUNTER — Emergency Department (HOSPITAL_COMMUNITY)
Admission: EM | Admit: 2018-05-06 | Discharge: 2018-05-06 | Disposition: A | Discharge status: Home / Self Care | Payer: Medicaid Other | Attending: Emergency Medicine | Admitting: Emergency Medicine

## 2018-05-06 ENCOUNTER — Other Ambulatory Visit: Payer: Self-pay

## 2018-05-06 ENCOUNTER — Encounter (HOSPITAL_COMMUNITY): Payer: Self-pay | Admitting: Emergency Medicine

## 2018-05-06 DIAGNOSIS — R69 Illness, unspecified: Secondary | ICD-10-CM

## 2018-05-06 DIAGNOSIS — J101 Influenza due to other identified influenza virus with other respiratory manifestations: Secondary | ICD-10-CM | POA: Insufficient documentation

## 2018-05-06 DIAGNOSIS — Z79899 Other long term (current) drug therapy: Secondary | ICD-10-CM | POA: Insufficient documentation

## 2018-05-06 DIAGNOSIS — J111 Influenza due to unidentified influenza virus with other respiratory manifestations: Secondary | ICD-10-CM

## 2018-05-06 MED ORDER — ONDANSETRON 8 MG PO TBDP: 8.0000 mg | ORAL_TABLET | Freq: Three times a day (TID) | ORAL | 0 refills | Status: AC | PRN

## 2018-05-06 MED ORDER — ONDANSETRON 4 MG PO TBDP
8.0000 mg | ORAL_TABLET | Freq: Once | ORAL | Status: AC
  Administered 2018-05-06: 8 mg via ORAL
  Filled 2018-05-06: qty 2

## 2018-05-06 MED ORDER — BENZONATATE 100 MG PO CAPS: 100.0000 mg | ORAL_CAPSULE | Freq: Three times a day (TID) | ORAL | 0 refills | Status: AC

## 2018-05-06 MED ORDER — IBUPROFEN 400 MG PO TABS
600.0000 mg | ORAL_TABLET | Freq: Once | ORAL | Status: AC
  Administered 2018-05-06: 600 mg via ORAL
  Filled 2018-05-06: qty 1

## 2018-05-06 MED ORDER — FLUTICASONE PROPIONATE 50 MCG/ACT NA SUSP: 2.0000 | Freq: Every day | NASAL | 0 refills | Status: AC

## 2018-05-06 NOTE — Discharge Instructions (Addendum)
Your symptoms are likely consistent with a viral illness. Viruses do not require or respond to antibiotics. Treatment is symptomatic care and it is important to note that these symptoms may last for 7-14 days.   Hand washing: Wash your hands throughout the day, but especially before and after touching the face, using the restroom, sneezing, coughing, or touching surfaces that have been coughed or sneezed upon. Hydration: Symptoms will be intensified and complicated by dehydration. Dehydration can also extend the duration of symptoms. Drink plenty of fluids and get plenty of rest. You should be drinking at least half a liter of water an hour to stay hydrated. Electrolyte drinks (ex. Gatorade, Powerade, Pedialyte) are also encouraged. You should be drinking enough fluids to make your urine light yellow, almost clear. If this is not the case, you are not drinking enough water. Please note that some of the treatments indicated below will not be effective if you are not adequately hydrated. Diet: Please concentrate on hydration, however, you may introduce food slowly.  Start with a clear liquid diet, progressed to a full liquid diet, and then bland solids as you are able. Pain or fever: Ibuprofen, Naproxen, or acetaminophen (generic for Tylenol) for pain or fever.  Antiinflammatory medications: Take 600 mg of ibuprofen every 6 hours or 440 mg (over the counter dose) to 500 mg (prescription dose) of naproxen every 12 hours for the next 3 days. After this time, these medications may be used as needed for pain. Take these medications with food to avoid upset stomach. Choose only one of these medications, do not take them together. Acetaminophen (generic for Tylenol): Should you continue to have additional pain while taking the ibuprofen or naproxen, you may add in acetaminophen as needed. Your daily total maximum amount of acetaminophen from all sources should be limited to 4000mg /day for persons without liver  problems, or 2000mg /day for those with liver problems. Nausea/vomiting: Use the ondansetron (generic for Zofran) for nausea or vomiting.  Diarrhea: May use medications such as loperamide (Imodium) or Bismuth subsalicylate (Pepto-Bismol). Cough: Use the benzonatate (generic for Tessalon) for cough.  Zyrtec or Claritin: May add these medication daily to control underlying symptoms of congestion, sneezing, and other signs of allergies.  These medications are available over-the-counter. Generics: Cetirizine (generic for Zyrtec) and loratadine (generic for Claritin). Fluticasone: Use fluticasone (generic for Flonase), as directed, for nasal and sinus congestion.  This medication is available over-the-counter. Congestion: Plain guaifenesin (generic for plain Mucinex) may help relieve congestion. Saline sinus rinses and saline nasal sprays may also help relieve congestion. If you do not have high blood pressure, heart problems, or an allergy to such medications, you may also try phenylephrine or Sudafed. Sore throat: Warm liquids or Chloraseptic spray may help soothe a sore throat. Gargle twice a day with a salt water solution made from a half teaspoon of salt in a cup of warm water.  Follow up: Follow up with a primary care provider within the next two weeks should symptoms fail to resolve. Return: Return to the ED for significantly worsening symptoms, shortness of breath, persistent vomiting, large amounts of blood in stool, or any other major concerns.  For prescription assistance, may try using prescription discount sites or apps, such as goodrx.com

## 2018-05-06 NOTE — ED Notes (Signed)
Pt able to consume/hold down PO fluids.

## 2018-05-06 NOTE — ED Notes (Signed)
Patient verbalizes understanding of discharge instructions. Opportunity for questioning and answers were provided. Armband removed by staff, pt discharged from ED. EDP notified of temp. Pt denies anymore nausea and reports decreased aches/body pains. Pt stating she would like to be discharged. Pt ambulatory to lobby. Prescriptions and follow up care reviewed.

## 2018-05-06 NOTE — ED Triage Notes (Signed)
Pt with fever since Friday highest of 103 at home with body aches and chills, N/V/D. Pt is alert at triage.

## 2018-05-06 NOTE — ED Provider Notes (Signed)
[facility]   CSN: [id number] Arrival date & time: 05/06/18  1420     History   Chief Complaint Chief Complaint  Patient presents with  . Influenza    HPI Laura Krause is a 28 y.o. female.  HPI   Laura Krause is a 28 y.o. female, with a history of GERD, sickle cell trait, presenting to the ED with cough, nasal congestion, fever, chills, and body aches for the past 5 days.  Accompanied by nausea, vomiting, and diarrhea. She has been intermittently taking Tylenol, ibuprofen, and Mucinex. Denies shortness of breath, chest pain, abdominal pain, hematochezia/melena, difficulty swallowing, rash, neck stiffness, or any other complaints.     Past Medical History:  Diagnosis Date  . Daily headache   . Depression   . Gallstones   . GERD (gastroesophageal reflux disease)   . S/P appendectomy 05/01/2017  . S/P cesarean section 05/01/2017  . Sickle cell trait (HCC)   . UTI (lower urinary tract infection) ~ 02/2014   "not recurrent"    Patient Active Problem List   Diagnosis Date Noted  . Acute blood loss anemia 05/17/2017  . Indication for care or intervention related to labor and delivery, antepartum 05/01/2017  . S/P cesarean section 05/01/2017  . S/P appendectomy 05/01/2017  . Abdominal pain during pregnancy in third trimester 04/20/2017  . Gastroenteritis, acute 10/20/2014  . Fever 10/20/2014  . Tachycardia 10/20/2014  . Abdominal pain, acute 10/20/2014  . Acute hypokalemia 10/20/2014  . Leukocytosis 10/20/2014  . Scoliosis 07/12/2014    Past Surgical History:  Procedure Laterality Date  . APPENDECTOMY    . APPENDECTOMY N/A 05/01/2017   Procedure: APPENDECTOMY;  Surgeon: Sherian ReinBovard-Stuckert, Jody, MD;  Location: [facility];  Service: Obstetrics;  Laterality: N/A;  . CESAREAN SECTION    . CESAREAN SECTION N/A 05/01/2017   Procedure: CESAREAN SECTION;  Surgeon: Sherian ReinBovard-Stuckert, Jody, MD;  Location: [facility];  Service: Obstetrics;  Laterality: N/A;     OB History    Gravida  2   Para  1   Term      Preterm  1   AB  1   Living  1     SAB      TAB  1   Ectopic      Multiple  0   Live Births  1            Home Medications    Prior to Admission medications   Medication Sig Start Date End Date Taking? Authorizing Provider  acetaminophen (TYLENOL) 500 MG tablet Take 1,000 mg by mouth every 6 (six) hours as needed for headache (pain).    [provider]  Acetaminophen-Caffeine (EXCEDRIN TENSION HEADACHE) 500-65 MG TABS Take 2 tablets by mouth every 4 (four) hours as needed (pain/headache).    [provider]  benzonatate (TESSALON) 100 MG capsule Take 1 capsule (100 mg total) by mouth every 8 (eight) hours. 05/06/18   Mina Carlisi C, PA-C  butalbital-acetaminophen-caffeine (FIORICET, ESGIC) 50-325-40 MG tablet Take 1 tablet by mouth every 4 (four) hours as needed for headache. Patient not taking: Reported on 05/03/2018 05/19/17   Meisinger, Tawanna Coolerodd, MD  cyclobenzaprine (FLEXERIL) 5 MG tablet Take 1 tablet (5 mg total) by mouth 3 (three) times daily as needed for muscle spasms (or neck pain/headache). Patient not taking: Reported on 05/03/2018 05/19/17   Meisinger, Tawanna Coolerodd, MD  ferrous sulfate 325 (65 FE) MG tablet Take 325 mg by mouth daily  with breakfast.    [provider]  fluticasone (FLONASE) 50 MCG/ACT nasal spray Place 2 sprays into both nostrils daily. 05/06/18   Si Jachim C, PA-C  ibuprofen (ADVIL,MOTRIN) 800 MG tablet Take 1 tablet (800 mg total) by mouth every 8 (eight) hours as needed. Patient not taking: Reported on 05/03/2018 05/04/17   Edwinna AreolaBanga, Cecilia Worema, DO  Multiple Vitamins-Minerals (ZINC PO) Take 1 tablet by mouth daily.    [provider]  NIFEdipine (PROCARDIA-XL/ADALAT CC) 30 MG 24 hr tablet Take 1 tablet (30 mg total) by mouth daily. Patient not taking: Reported on 05/03/2018 05/20/17   Meisinger, Tawanna Coolerodd, MD  ondansetron  (ZOFRAN ODT) 8 MG disintegrating tablet Take 1 tablet (8 mg total) by mouth every 8 (eight) hours as needed for nausea or vomiting. 05/06/18   Lilliemae Fruge C, PA-C  oxyCODONE (OXY IR/ROXICODONE) 5 MG immediate release tablet Take 1 tablet (5 mg total) by mouth every 4 (four) hours as needed for severe pain or breakthrough pain. Patient not taking: Reported on 05/03/2018 05/04/17   Edwinna AreolaBanga, Cecilia Worema, DO  polyethylene glycol (MIRALAX / GLYCOLAX) packet Take 17 g by mouth daily. Patient not taking: Reported on 05/03/2018 04/22/17   Bovard-Stuckert, Augusto GambleJody, MD  potassium chloride SA (K-DUR,KLOR-CON) 20 MEQ tablet Take 1 tablet (20 mEq total) by mouth 3 (three) times daily. Patient not taking: Reported on 05/03/2018 05/19/17   Meisinger, Tawanna Coolerodd, MD  Prenatal Vit-Fe Fumarate-FA (PRENATAL MULTIVITAMIN) TABS tablet Take 1 tablet by mouth daily at 12 noon. Patient not taking: Reported on 05/03/2018 04/22/17   Sherian ReinBovard-Stuckert, Jody, MD    Family History Family History  Problem Relation Age of Onset  . Healthy Mother   . Arthritis Maternal Grandmother   . Stroke Maternal Grandfather   . Hypertension Maternal Grandfather     Social History Social History   Tobacco Use  . Smoking status: Never Smoker  . Smokeless tobacco: Never Used  Substance Use Topics  . Alcohol use: Yes    Comment: 10/20/2014 "might have 2 glasses of wine once/nmonth"  . Drug use: No     Allergies   Patient has no known allergies.   Review of Systems Review of Systems  Constitutional: Positive for chills and fever.  HENT: Positive for congestion and rhinorrhea. Negative for ear discharge, ear pain, sinus pain, sore throat and trouble swallowing.   Respiratory: Positive for cough. Negative for shortness of breath.   Cardiovascular: Negative for chest pain and leg swelling.  Gastrointestinal: Positive for diarrhea, nausea and vomiting. Negative for abdominal pain and blood in stool.  Genitourinary: Negative for difficulty urinating.   Musculoskeletal: Positive for myalgias. Negative for neck pain and neck stiffness.  Skin: Negative for rash.  Neurological: Positive for headaches. Negative for dizziness, syncope, weakness and light-headedness.  All other systems reviewed and are negative.    Physical Exam Updated Vital Signs BP 105/67 (BP Location: Right Arm)   Pulse (!) 118   Temp (!) 101 F (38.3 C) (Oral)   Resp 18   LMP 04/15/2018   SpO2 100%   Physical Exam Vitals signs and nursing note reviewed.  Constitutional:      General: She is not in acute distress.    Appearance: She is well-developed. She is not diaphoretic.  HENT:     Head: Normocephalic and atraumatic.     Nose: Mucosal edema and congestion present.     Mouth/Throat:     Mouth: Mucous membranes are moist.     Pharynx: Oropharynx is  clear.  Eyes:     Conjunctiva/sclera: Conjunctivae normal.  Neck:     Musculoskeletal: Neck supple.  Cardiovascular:     Rate and Rhythm: Regular rhythm. Tachycardia present.     Pulses: Normal pulses.     Heart sounds: Normal heart sounds.     Comments: Mildly tachycardic Pulmonary:     Effort: Pulmonary effort is normal. No respiratory distress.     Breath sounds: Normal breath sounds.  Abdominal:     Palpations: Abdomen is soft.     Tenderness: There is no abdominal tenderness. There is no guarding.  Musculoskeletal:     Right lower leg: No edema.     Left lower leg: No edema.  Lymphadenopathy:     Cervical: No cervical adenopathy.  Skin:    General: Skin is warm and dry.  Neurological:     Mental Status: She is alert.  Psychiatric:        Mood and Affect: Mood and affect normal.        Speech: Speech normal.        Behavior: Behavior normal.      ED Treatments / Results  Labs (all labs ordered are listed, but only abnormal results are displayed) Labs Reviewed - No data to display  EKG None  Radiology No results found.  Procedures Procedures (including critical care  time)  Medications Ordered in ED Medications  ondansetron (ZOFRAN-ODT) disintegrating tablet 8 mg (8 mg Oral Given 05/06/18 1541)  ibuprofen (ADVIL,MOTRIN) tablet 600 mg (600 mg Oral Given 05/06/18 1542)     Initial Impression / Assessment and Plan / ED Course  I have reviewed the triage vital signs and the nursing notes.  Pertinent labs & imaging results that were available during my care of the patient were reviewed by me and considered in my medical decision making (see chart for details).     Patient presents with symptoms suggestive of influenza.  Febrile and mildly tachycardic, but nontoxic-appearing, not tachypneic, and not hypotensive.  We discussed options for management here in the ED, including IV fluids and IV medications, such as Toradol.  Patient states she prefers to have oral medications here and then go home to rest. Tolerating PO prior to discharge. The patient was given instructions for home care as well as return precautions. Patient voices understanding of these instructions, accepts the plan, and is comfortable with discharge.   Vitals:   05/06/18 1448 05/06/18 1640  BP: 105/67 110/73  Pulse: (!) 118 (!) 119  Resp: 18 16  Temp: (!) 101 F (38.3 C) (!) 102.6 F (39.2 C)  TempSrc: Oral Oral  SpO2: 100% 100%   We discussed her fever.  Patient states she actually feels better in her ED course.  She does not want to stay for any further intervention and states she is comfortable with managing her illness at home at this time.  Final Clinical Impressions(s) / ED Diagnoses   Final diagnoses:  Influenza-like illness    ED Discharge Orders         Ordered    benzonatate (TESSALON) 100 MG capsule  Every 8 hours     05/06/18 1544    fluticasone (FLONASE) 50 MCG/ACT nasal spray  Daily     05/06/18 1544    ondansetron (ZOFRAN ODT) 8 MG disintegrating tablet  Every 8 hours PRN     05/06/18 1544           Meriel Kelliher, Hillard Danker, PA-C 05/06/18 1654    Madilyn Hook,  Lanora Manis, MD 05/06/18 1925

## 2019-09-28 IMAGING — CT CT ABD-PELV W/ CM
2 of 4 series · 17 of 46 positions shown, 19 images · IV contrast (OMNIPAQUE)
Comparison: 10/20/2014

CLINICAL DATA: Severe right lower quadrant abdominal pain, 32 weeks
6 days pregnant

EXAM:
CT ABDOMEN AND PELVIS WITH CONTRAST
TECHNIQUE: Multidetector CT imaging of the abdomen and pelvis was performed
using the standard protocol following bolus administration of
intravenous contrast.
CONTRAST:  100mL UORCFM-UJJ IOPAMIDOL (UORCFM-UJJ) INJECTION 61%

[Series 2: routine abdomen/pelvis with · axial · 0.64mm/px · z∈[+787,+1167]mm · 14 of 84 slices shown, 16 images]
[im 4/84  soft-tissue]
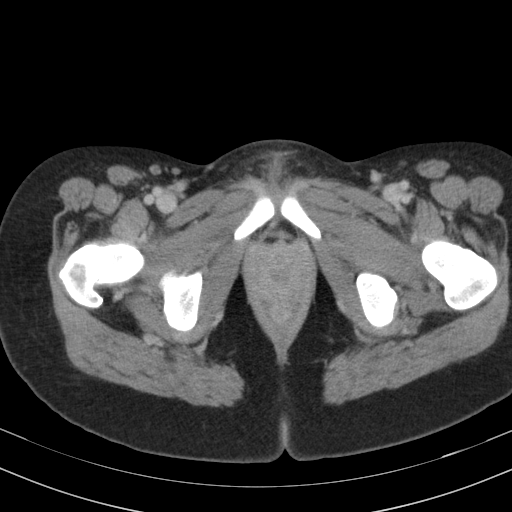
[im 4/84  bone]
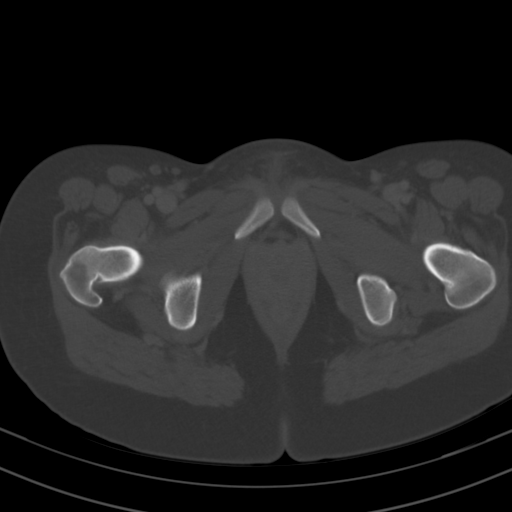
[im 10/84  soft-tissue]
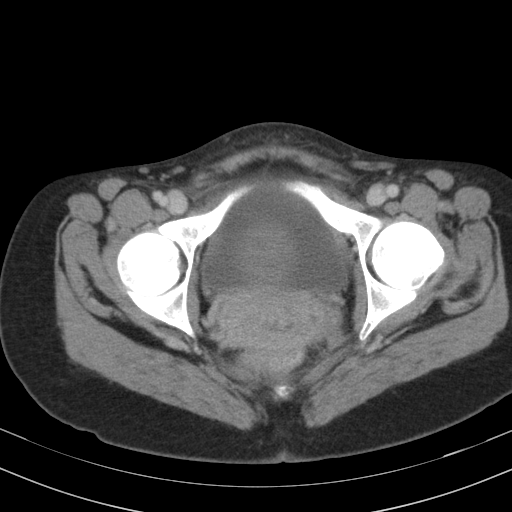
[im 16/84  soft-tissue]
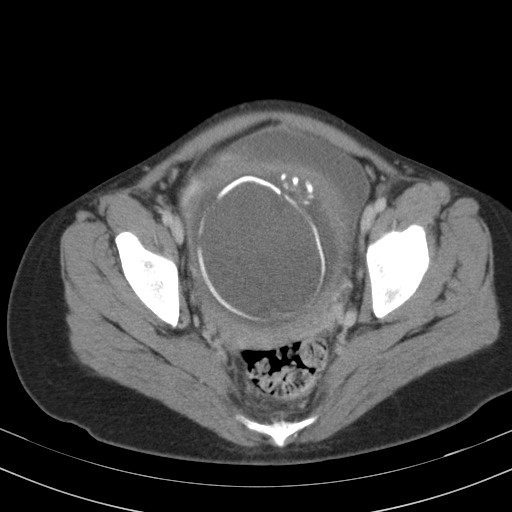
[im 23/84  soft-tissue]
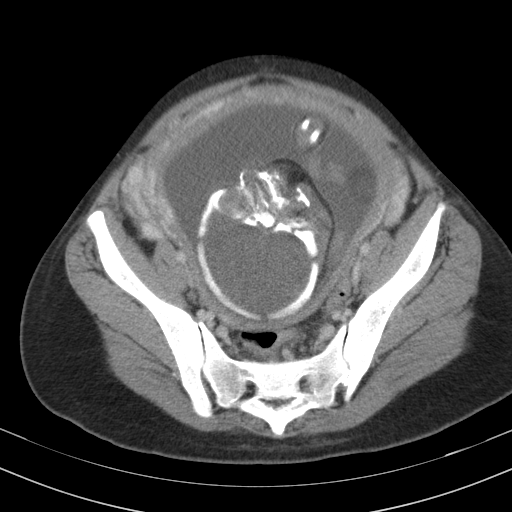
[im 29/84  soft-tissue]
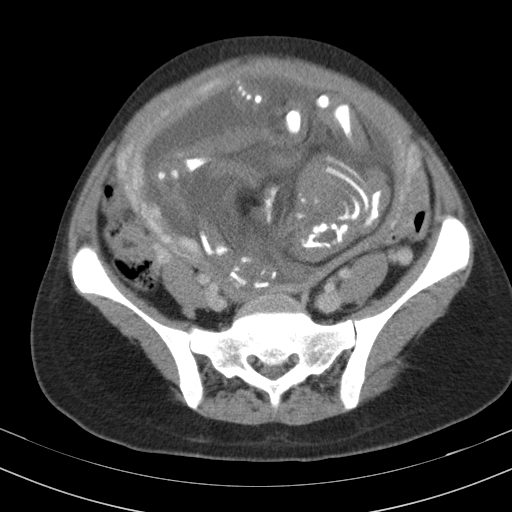
[im 32/84  soft-tissue]
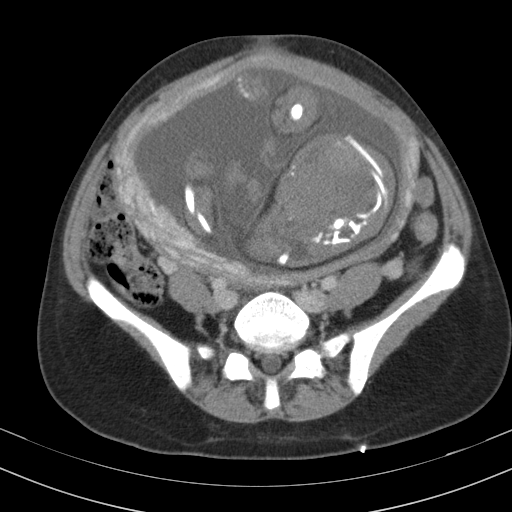
[im 39/84  soft-tissue]
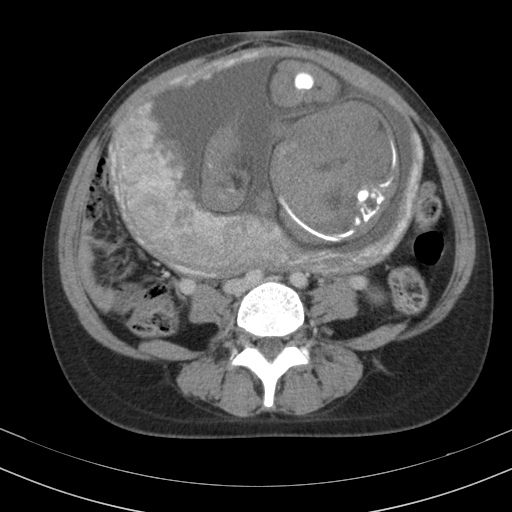
[im 45/84  soft-tissue]
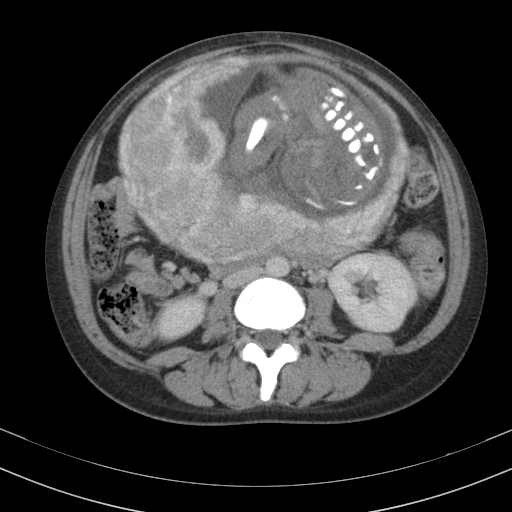
[im 52/84  soft-tissue]
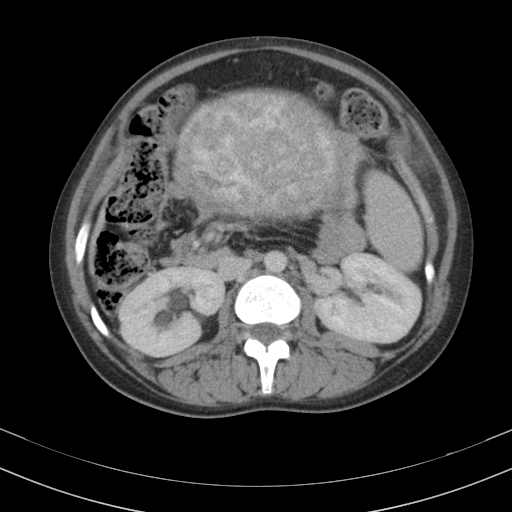
[im 52/84  bone]
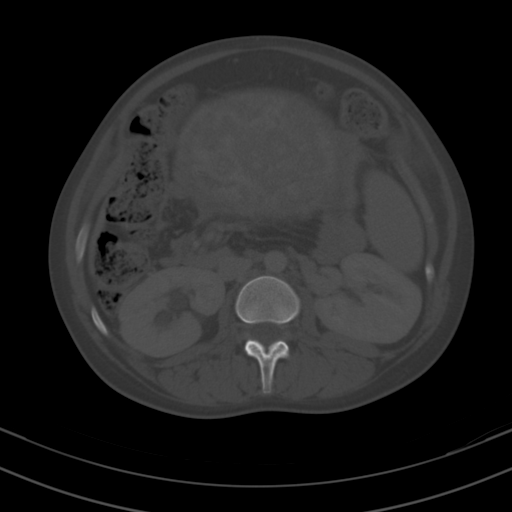
[im 55/84  soft-tissue]
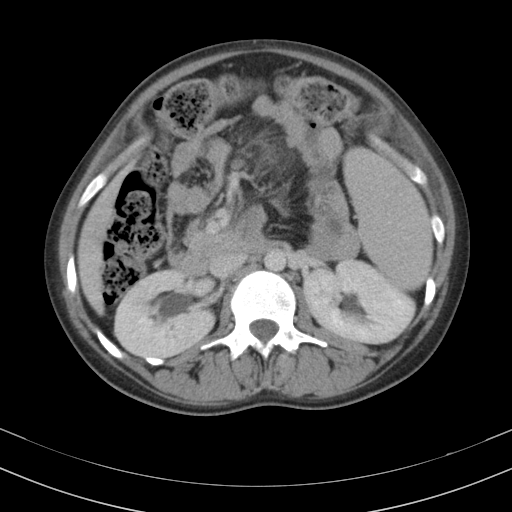
[im 61/84  soft-tissue]
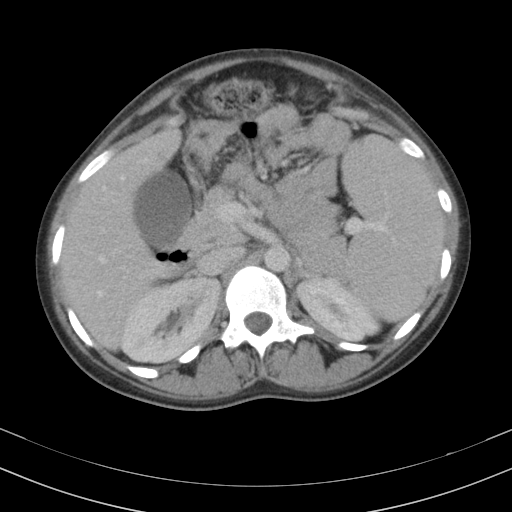
[im 68/84  soft-tissue]
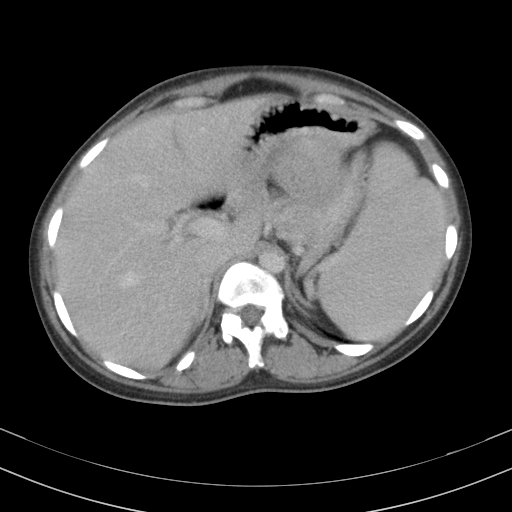
[im 74/84  soft-tissue]
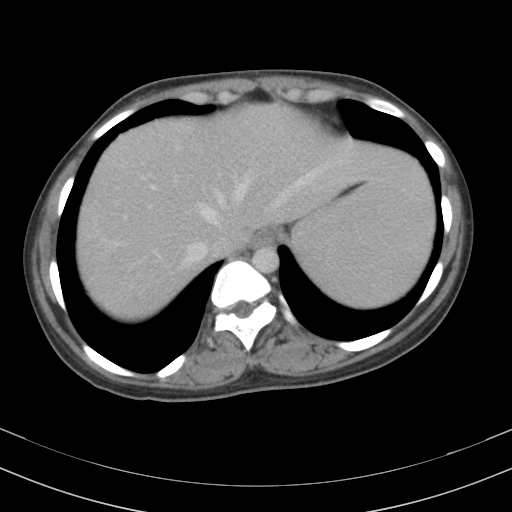
[im 80/84  soft-tissue]
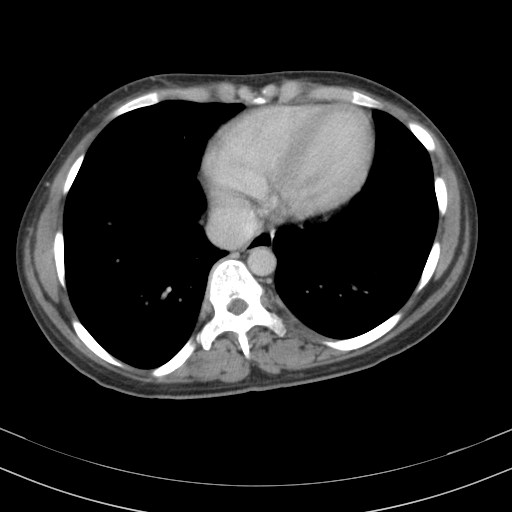

[Series 602: <mpr thick range> · coronal · 0.82mm/px · 3 of 51 slices shown]
[im 17/51  soft-tissue]
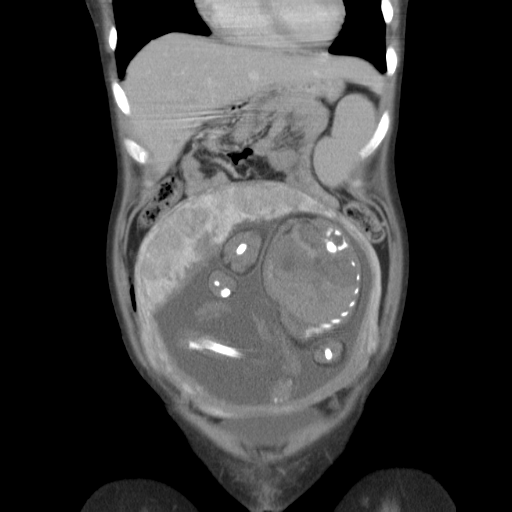
[im 23/51  soft-tissue]
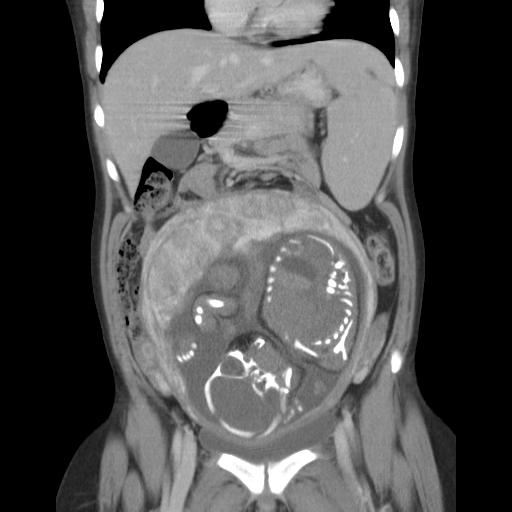
[im 28/51  soft-tissue]
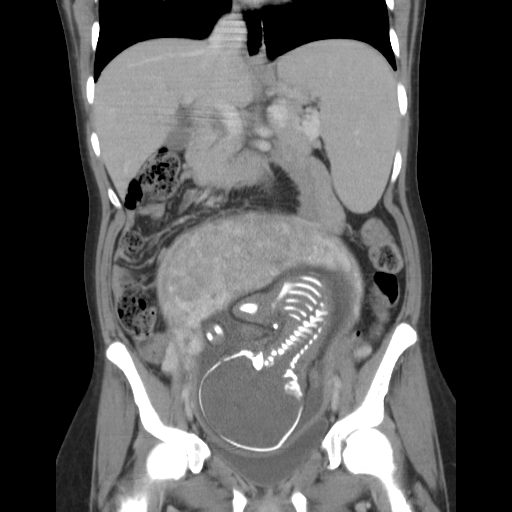

[17 of 46 positions shown; findings below may reference images not displayed]

FINDINGS: Lower chest: Lung bases are clear.

Hepatobiliary: Liver is within normal limits.

Layering 5 mm gallstone (series 2/ image 23), without associated
inflammatory changes. No intrahepatic or extrahepatic ductal
dilatation.

Pancreas: Within normal limits.

Spleen: Enlarged, measuring 14.5 cm in craniocaudal dimension,
likely related to the patient's pregnancy.

Adrenals/Urinary Tract: Adrenal glands are within normal limits.

Kidneys are within normal limits.  No hydronephrosis.

Bladder is within normal limits.

Stomach/Bowel: Stomach is within normal limits.

No evidence of bowel obstruction.

Candidate/equivocal appendix adjacent to the gravid uterus (sagittal
image 10 ; coronal image 23) is poorly visualized but normal.
Regardless, there is no evidence of acute appendicitis.

Vascular/Lymphatic: No evidence of abdominal aortic aneurysm.

No suspicious abdominopelvic lymphadenopathy.

Reproductive: Gravid uterus.  Cephalic presentation.

Placenta along the right lateral aspect of the uterine body and free
of the cervical os.

Right ovary is within normal limits.  No left adnexal mass.

Other: No abdominopelvic ascites.

Musculoskeletal: Visualized osseous structures are within normal
limits.
IMPRESSION: No evidence of acute appendicitis.

Cholelithiasis, without associated inflammatory changes.

Gravid uterus.
